# Patient Record
Sex: Male | Born: 1955 | Hispanic: Yes | Marital: Single | Smoking: Never smoker
Health system: Southern US, Community
[De-identification: ages and names within clinical notes are randomized; demographics above are authoritative.]

## PROBLEM LIST (undated history)

## (undated) DIAGNOSIS — I10 Essential (primary) hypertension: Secondary | ICD-10-CM

## (undated) DIAGNOSIS — F419 Anxiety disorder, unspecified: Secondary | ICD-10-CM

## (undated) HISTORY — PX: NO PAST SURGERIES: SHX2092

---

## 2013-11-26 ENCOUNTER — Encounter (HOSPITAL_COMMUNITY): Payer: Self-pay | Admitting: Emergency Medicine

## 2013-11-26 ENCOUNTER — Emergency Department (HOSPITAL_COMMUNITY)
Admission: EM | Admit: 2013-11-26 | Discharge: 2013-11-26 | Disposition: A | Discharge status: Home / Self Care | Payer: BC Managed Care – PPO | Attending: Emergency Medicine | Admitting: Emergency Medicine

## 2013-11-26 DIAGNOSIS — Z7982 Long term (current) use of aspirin: Secondary | ICD-10-CM | POA: Insufficient documentation

## 2013-11-26 DIAGNOSIS — I10 Essential (primary) hypertension: Secondary | ICD-10-CM | POA: Insufficient documentation

## 2013-11-26 DIAGNOSIS — K2990 Gastroduodenitis, unspecified, without bleeding: Secondary | ICD-10-CM

## 2013-11-26 DIAGNOSIS — Z79899 Other long term (current) drug therapy: Secondary | ICD-10-CM | POA: Insufficient documentation

## 2013-11-26 DIAGNOSIS — K279 Peptic ulcer, site unspecified, unspecified as acute or chronic, without hemorrhage or perforation: Secondary | ICD-10-CM | POA: Insufficient documentation

## 2013-11-26 DIAGNOSIS — K2970 Gastritis, unspecified, without bleeding: Secondary | ICD-10-CM | POA: Insufficient documentation

## 2013-11-26 DIAGNOSIS — R1013 Epigastric pain: Secondary | ICD-10-CM | POA: Insufficient documentation

## 2013-11-26 HISTORY — DX: Essential (primary) hypertension: I10

## 2013-11-26 LAB — COMPREHENSIVE METABOLIC PANEL
ALBUMIN: 4.1 g/dL (ref 3.5–5.2)
ALT: 17 U/L (ref 0–53)
AST: 19 U/L (ref 0–37)
Alkaline Phosphatase: 73 U/L (ref 39–117)
Anion gap: 15 (ref 5–15)
BUN: 15 mg/dL (ref 6–23)
CO2: 24 meq/L (ref 19–32)
Calcium: 9.2 mg/dL (ref 8.4–10.5)
Chloride: 99 mEq/L (ref 96–112)
Creatinine, Ser: 1.25 mg/dL (ref 0.50–1.35)
GFR calc Af Amer: 71 mL/min — ABNORMAL LOW (ref >90)
GFR, EST NON AFRICAN AMERICAN: 61 mL/min — AB (ref >90)
Glucose, Bld: 103 mg/dL — ABNORMAL HIGH (ref 70–99)
POTASSIUM: 4.5 meq/L (ref 3.7–5.3)
SODIUM: 138 meq/L (ref 137–147)
Total Bilirubin: 0.9 mg/dL (ref 0.3–1.2)
Total Protein: 7.1 g/dL (ref 6.0–8.3)

## 2013-11-26 LAB — CBC WITH DIFFERENTIAL/PLATELET
Basophils Absolute: 0 10*3/uL (ref 0.0–0.1)
Basophils Relative: 0 % (ref 0–1)
Eosinophils Absolute: 0.2 10*3/uL (ref 0.0–0.7)
Eosinophils Relative: 3 % (ref 0–5)
HEMATOCRIT: 47.9 % (ref 39.0–52.0)
Hemoglobin: 16.6 g/dL (ref 13.0–17.0)
LYMPHS PCT: 28 % (ref 12–46)
Lymphs Abs: 2.2 10*3/uL (ref 0.7–4.0)
MCH: 32.9 pg (ref 26.0–34.0)
MCHC: 34.7 g/dL (ref 30.0–36.0)
MCV: 95 fL (ref 78.0–100.0)
Monocytes Absolute: 0.7 10*3/uL (ref 0.1–1.0)
Monocytes Relative: 9 % (ref 3–12)
NEUTROS ABS: 4.6 10*3/uL (ref 1.7–7.7)
NEUTROS PCT: 60 % (ref 43–77)
Platelets: 210 10*3/uL (ref 150–400)
RBC: 5.04 MIL/uL (ref 4.22–5.81)
RDW: 12.3 % (ref 11.5–15.5)
WBC: 7.7 10*3/uL (ref 4.0–10.5)

## 2013-11-26 LAB — LIPASE, BLOOD: Lipase: 22 U/L (ref 11–59)

## 2013-11-26 MED ORDER — OMEPRAZOLE 20 MG PO CPDR: 20.0000 mg | DELAYED_RELEASE_CAPSULE | Freq: Two times a day (BID) | ORAL | Status: DC

## 2013-11-26 NOTE — Discharge Instructions (Signed)
Food Choices for Peptic Ulcer Disease When you have peptic ulcer disease, the foods you eat and your eating habits are very important. Choosing the right foods can help ease the discomfort of peptic ulcer disease. WHAT GENERAL GUIDELINES DO I NEED TO FOLLOW?  Choose fruits, vegetables, whole grains, and low-fat meat, fish, and poultry.   Keep a food diary to identify foods that cause symptoms.  Avoid foods that cause irritation or pain. These may be different for different people.  Eat frequent small meals instead of three large meals each day. The pain may be worse when your stomach is empty.  Avoid eating close to bedtime. WHAT FOODS ARE NOT RECOMMENDED? The following are some foods and drinks that may worsen your symptoms:  Black, white, and red pepper.  Hot sauce.  Chili peppers.  Chili powder.  Chocolate and cocoa.   Alcohol.  Tea, coffee, and cola (regular and decaffeinated). The items listed above may not be a complete list of foods and beverages to avoid. Contact your dietitian for more information. Document Released: 07/23/2011 Document Revised: 05/05/2013 Document Reviewed: 03/04/2013 [facility] LtdExitCare Patient Information 2015 [place], [place]. This information is not intended to replace advice given to you by your health care provider. Make sure you discuss any questions you have with your health care provider.  Abdominal Pain Many things can cause belly (abdominal) pain. Most times, the belly pain is not dangerous. Many cases of belly pain can be watched and treated at home. HOME CARE   Do not take medicines that help you go poop (laxatives) unless told to by your doctor.  Only take medicine as told by your doctor.  Eat or drink as told by your doctor. Your doctor will tell you if you should be on a special diet. GET HELP IF:  You do not know what is causing your belly pain.  You have belly pain while you are sick to your stomach (nauseous) or have runny poop  (diarrhea).  You have pain while you pee or poop.  Your belly pain wakes you up at night.  You have belly pain that gets worse or better when you eat.  You have belly pain that gets worse when you eat fatty foods.  You have a fever. GET HELP RIGHT AWAY IF:   The pain does not go away within 2 hours.  You keep throwing up (vomiting).  The pain changes and is only in the right or left part of the belly.  You have bloody or tarry looking poop. MAKE SURE YOU:   Understand these instructions.  Will watch your condition.  Will get help right away if you are not doing well or get worse. Document Released: 10/17/2007 Document Revised: 05/05/2013 Document Reviewed: 01/07/2013 [facility] CenterExit[facility] Patient Information 2015 [place]Care, [place]. This information is not intended to replace advice given to you by your health care provider. Make sure you discuss any questions you have with your health care provider.  Gastritis, Adult Gastritis is soreness and swelling (inflammation) of the lining of the stomach. Gastritis can develop as a sudden onset (acute) or long-term (chronic) condition. If gastritis is not treated, it can lead to stomach bleeding and ulcers. CAUSES  Gastritis occurs when the stomach lining is weak or damaged. Digestive juices from the stomach then inflame the weakened stomach lining. The stomach lining may be weak or damaged due to viral or bacterial infections. One common bacterial infection is the Helicobacter pylori infection. Gastritis can also result from excessive alcohol consumption, taking  certain medicines, or having too much acid in the stomach.  SYMPTOMS  In some cases, there are no symptoms. When symptoms are present, they may include:  Pain or a burning sensation in the upper abdomen.  Nausea.  Vomiting.  An uncomfortable feeling of fullness after eating. DIAGNOSIS  Your caregiver may suspect you have gastritis based on your symptoms and a physical exam. To determine  the cause of your gastritis, your caregiver may perform the following:  Blood or stool tests to check for the H pylori bacterium.  Gastroscopy. A thin, flexible tube (endoscope) is passed down the esophagus and into the stomach. The endoscope has a light and camera on the end. Your caregiver uses the endoscope to view the inside of the stomach.  Taking a tissue sample (biopsy) from the stomach to examine under a microscope. TREATMENT  Depending on the cause of your gastritis, medicines may be prescribed. If you have a bacterial infection, such as an H pylori infection, antibiotics may be given. If your gastritis is caused by too much acid in the stomach, H2 blockers or antacids may be given. Your caregiver may recommend that you stop taking aspirin, ibuprofen, or other nonsteroidal anti-inflammatory drugs (NSAIDs). HOME CARE INSTRUCTIONS  Only take over-the-counter or prescription medicines as directed by your caregiver.  If you were given antibiotic medicines, take them as directed. Finish them even if you start to feel better.  Drink enough fluids to keep your urine clear or pale yellow.  Avoid foods and drinks that make your symptoms worse, such as:  Caffeine or alcoholic drinks.  Chocolate.  Peppermint or mint flavorings.  Garlic and onions.  Spicy foods.  Citrus fruits, such as oranges, lemons, or limes.  Tomato-based foods such as sauce, chili, salsa, and pizza.  Fried and fatty foods.  Eat small, frequent meals instead of large meals. SEEK IMMEDIATE MEDICAL CARE IF:   You have black or dark red stools.  You vomit blood or material that looks like coffee grounds.  You are unable to keep fluids down.  Your abdominal pain gets worse.  You have a fever.  You do not feel better after 1 week.  You have any other questions or concerns. MAKE SURE YOU:  Understand these instructions.  Will watch your condition.  Will get help right away if you are not doing well  or get worse. Document Released: 04/24/2001 Document Revised: 10/30/2011 Document Reviewed: 06/13/2011 [facility] Patient Information 2015 [place], [place]. This information is not intended to replace advice given to you by your health care provider. Make sure you discuss any questions you have with your health care provider.

## 2013-11-26 NOTE — ED Notes (Signed)
Pt c/o right sided abd pain x 1 year that is getting more severe; pt sent here for eval

## 2013-11-26 NOTE — ED Provider Notes (Signed)
CSN: [id number]     Arrival date & time 11/26/13  1454 History   First MD Initiated Contact with Patient 11/26/13 1801     Chief Complaint  Patient presents with  . Abdominal Pain      HPI  Patient presents with an episode of epigastric abdominal pain almost daily for the last year. He states that it is fairly well localized epigastric and right mid abdomen. It is relieved most often with antacids yogurt milk or ice cream. His never taken an acid of the Rolaids. He drinks alcohol infrequently. He does not smoke. 2 servings of caffeine a day. No anti-inflammatory use. No fatty food intolerances. No no left-sided or back pain to suggest pancreatitis and no excessive alcohol use.  Past Medical History  Diagnosis Date  . Hypertension    History reviewed. No pertinent past surgical history. History reviewed. No pertinent family history. History  Substance Use Topics  . Smoking status: Never Smoker   . Smokeless tobacco: Not on file  . Alcohol Use: Yes     Comment: occ    Review of Systems  Constitutional: Negative for fever, chills, diaphoresis, appetite change and fatigue.  HENT: Negative for mouth sores, sore throat and trouble swallowing.   Eyes: Negative for visual disturbance.  Respiratory: Negative for cough, chest tightness, shortness of breath and wheezing.   Cardiovascular: Negative for chest pain.  Gastrointestinal: Positive for abdominal pain. Negative for nausea, vomiting, diarrhea and abdominal distention.  Endocrine: Negative for polydipsia, polyphagia and polyuria.  Genitourinary: Negative for dysuria, frequency and hematuria.  Musculoskeletal: Negative for gait problem.  Skin: Negative for color change, pallor and rash.  Neurological: Negative for dizziness, syncope, light-headedness and headaches.  Hematological: Does not bruise/bleed easily.  Psychiatric/Behavioral: Negative for behavioral problems and confusion.      Allergies  Review of patient's allergies  indicates no known allergies.  Home Medications   Prior to Admission medications   Medication Sig Start Date End Date Taking? Authorizing Provider  aspirin EC 81 MG tablet Take 81 mg by mouth daily.   Yes Historical Provider, MD  atorvastatin (LIPITOR) 10 MG tablet Take 10 mg by mouth daily.   Yes Historical Provider, MD  carvedilol (COREG) 3.125 MG tablet Take 3.125 mg by mouth daily.   Yes Historical Provider, MD  clonazePAM (KLONOPIN) 1 MG tablet Take 1 mg by mouth daily as needed for anxiety.   Yes Historical Provider, MD  niacin 500 MG tablet Take 500 mg by mouth daily.   Yes Historical Provider, MD  omega-3 acid ethyl esters (LOVAZA) 1 G capsule Take 1 g by mouth daily.   Yes Historical Provider, MD  valsartan-hydrochlorothiazide (DIOVAN-HCT) 320-25 MG per tablet Take 1 tablet by mouth daily.   Yes Historical Provider, MD  omeprazole (PRILOSEC) 20 MG capsule Take 1 capsule (20 mg total) by mouth 2 (two) times daily. 11/26/13   Rolland PorterMark Lonnie Reth, MD   BP 152/105  Pulse 73  Temp(Src) 97.2 F (36.2 C) (Oral)  Resp 18  Wt 205 lb 7 oz (93.186 kg)  SpO2 98% Physical Exam  Constitutional: He is oriented to person, place, and time. He appears well-developed and well-nourished. No distress.  HENT:  Head: Normocephalic.  Eyes: Conjunctivae are normal. Pupils are equal, round, and reactive to light. No scleral icterus.  Neck: Normal range of motion. Neck supple. No thyromegaly present.  Cardiovascular: Normal rate and regular rhythm.  Exam reveals no gallop and no friction rub.   No murmur heard. Pulmonary/Chest:  Effort normal and breath sounds normal. No respiratory distress. He has no wheezes. He has no rales.  Abdominal: Soft. Bowel sounds are normal. He exhibits no distension. There is tenderness. There is no rebound.    Musculoskeletal: Normal range of motion.  Neurological: He is alert and oriented to person, place, and time.  Skin: Skin is warm and dry. No rash noted.  Psychiatric: He  has a normal mood and affect. His behavior is normal.    ED Course  Procedures (including critical care time) Labs Review Labs Reviewed  COMPREHENSIVE METABOLIC PANEL - Abnormal; Notable for the following:    Glucose, Bld 103 (*)    GFR calc non Af Amer 61 (*)    GFR calc Af Amer 71 (*)    All other components within normal limits  CBC WITH DIFFERENTIAL  LIPASE, BLOOD  URINALYSIS, ROUTINE W REFLEX MICROSCOPIC    Imaging Review No results found.   EKG Interpretation None      MDM   Final diagnoses:  Epigastric pain  Peptic ulcer disease  Gastritis    Normal hepatobiliary and pancreatic enzymes. Plan is treatment with proton pump inhibitor. Diet discussed. Primary care followup.    Rolland Porter, MD 11/26/13 [phone]

## 2013-12-24 ENCOUNTER — Other Ambulatory Visit: Payer: Self-pay | Admitting: Gastroenterology

## 2013-12-24 ENCOUNTER — Ambulatory Visit
Admission: RE | Admit: 2013-12-24 | Discharge: 2013-12-24 | Disposition: A | Discharge status: Home / Self Care | Payer: BC Managed Care – PPO | Source: Ambulatory Visit | Attending: Gastroenterology | Admitting: Gastroenterology

## 2013-12-24 DIAGNOSIS — R1031 Right lower quadrant pain: Secondary | ICD-10-CM

## 2013-12-24 MED ORDER — IOHEXOL 300 MG/ML  SOLN
100.0000 mL | Freq: Once | INTRAMUSCULAR | Status: AC | PRN
  Administered 2013-12-24: 100 mL via INTRAVENOUS

## 2013-12-29 ENCOUNTER — Encounter (HOSPITAL_COMMUNITY): Payer: Self-pay | Admitting: *Deleted

## 2013-12-29 ENCOUNTER — Other Ambulatory Visit: Payer: Self-pay | Admitting: Urology

## 2014-01-04 ENCOUNTER — Encounter (HOSPITAL_COMMUNITY)
Admission: RE | Disposition: A | Discharge status: Home / Self Care | Payer: Self-pay | Source: Ambulatory Visit | Attending: Urology

## 2014-01-04 ENCOUNTER — Ambulatory Visit (HOSPITAL_COMMUNITY)
Admission: RE | Admit: 2014-01-04 | Discharge: 2014-01-04 | Disposition: A | Discharge status: Home / Self Care | Payer: BC Managed Care – PPO | Source: Ambulatory Visit | Attending: Urology | Admitting: Urology

## 2014-01-04 ENCOUNTER — Encounter (HOSPITAL_COMMUNITY): Payer: Self-pay | Admitting: General Practice

## 2014-01-04 ENCOUNTER — Ambulatory Visit (HOSPITAL_COMMUNITY): Payer: BC Managed Care – PPO

## 2014-01-04 DIAGNOSIS — Z79899 Other long term (current) drug therapy: Secondary | ICD-10-CM | POA: Diagnosis not present

## 2014-01-04 DIAGNOSIS — E039 Hypothyroidism, unspecified: Secondary | ICD-10-CM | POA: Diagnosis not present

## 2014-01-04 DIAGNOSIS — E7800 Pure hypercholesterolemia, unspecified: Secondary | ICD-10-CM | POA: Insufficient documentation

## 2014-01-04 DIAGNOSIS — N200 Calculus of kidney: Secondary | ICD-10-CM | POA: Diagnosis not present

## 2014-01-04 DIAGNOSIS — Z7982 Long term (current) use of aspirin: Secondary | ICD-10-CM | POA: Insufficient documentation

## 2014-01-04 HISTORY — DX: Anxiety disorder, unspecified: F41.9

## 2014-01-04 SURGERY — LITHOTRIPSY, ESWL
Anesthesia: LOCAL | Laterality: Right

## 2014-01-04 MED ORDER — CIPROFLOXACIN HCL 500 MG PO TABS
500.0000 mg | ORAL_TABLET | ORAL | Status: AC
  Administered 2014-01-04: 500 mg via ORAL
  Filled 2014-01-04: qty 1

## 2014-01-04 MED ORDER — DEXTROSE-NACL 5-0.45 % IV SOLN
INTRAVENOUS | Status: DC
  Administered 2014-01-04: 16:00:00 via INTRAVENOUS

## 2014-01-04 MED ORDER — DIAZEPAM 5 MG PO TABS
10.0000 mg | ORAL_TABLET | ORAL | Status: AC
  Administered 2014-01-04: 10 mg via ORAL
  Filled 2014-01-04: qty 2

## 2014-01-04 MED ORDER — DIPHENHYDRAMINE HCL 25 MG PO CAPS
25.0000 mg | ORAL_CAPSULE | ORAL | Status: AC
  Administered 2014-01-04: 25 mg via ORAL
  Filled 2014-01-04: qty 1

## 2014-01-04 NOTE — H&P (Signed)
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 + hx of stone at age 31, spontaneously passed, without analysis. he has 2nd stone several years ago, right side, post lithotripsy, without passage of granules, or f/u CT in [place].   He now notes 6 months of intermittent Right flank pain without radiation, or N,V. No hematuria. He has had CT evaluation showing a intheRight extra-renal pelvis, with ? intermittent obstruction . Pt drinks water only. No sodas.   Past Medical History Problems  1. History of hypercholesterolemia (V12.29) 2. History of hypothyroidism (V12.29)  Surgical History Problems  1. History of Hernia Repair  Current Meds 1. Aspirin 81 MG Oral Tablet;  Therapy: (Recorded:17Aug2015) to Recorded 2. Atorvastatin Calcium 10 MG Oral Tablet;  Therapy: (Recorded:17Aug2015) to Recorded 3. Carvedilol TABS;  Therapy: (Recorded:17Aug2015) to Recorded 4. Fenofibrate 50 MG Oral Capsule;  Therapy: (Recorded:17Aug2015) to Recorded 5. Niacin Flush Free 500 MG Oral Capsule;  Therapy: (Recorded:17Aug2015) to Recorded 6. Omega 3 CAPS;  Therapy: (Recorded:17Aug2015) to Recorded 7. Valsartan-Hydrochlorothiazide 320-25 MG Oral Tablet;  Therapy: (Recorded:17Aug2015) to Recorded  Allergies Medication  1. No Known Drug Allergies  Family History Problems  1. Family history of Heart trouble : Father  Social History Problems    Alcohol use (V49.89)   4-5/week   Caffeine use (V49.89)   2   Never a smoker   Single  Review of Systems Constitutional, skin, eye, otolaryngeal, hematologic/lymphatic, cardiovascular, pulmonary, endocrine, musculoskeletal, neurological and psychiatric system(s) were reviewed and pertinent findings if present are noted.   Genitourinary: no urinary frequency, no feelings of urinary urgency, no dysuria, no nocturia, no incontinence, no difficulty starting the urinary stream, urine stream is not weak, urinary stream does not start and stop, no incomplete emptying of bladder, no post-void dribbling, no hematuria and initiating urination does not require straining.  Gastrointestinal: flank pain, but no nausea, no vomiting, no abdominal pain, no heartburn, no diarrhea, no constipation and no melena.    Vitals Vital Signs [Data Includes: Last 1 Day]  Recorded: 17Aug2015 01:15PM  Weight: 205 lb  Blood Pressure: 122 / 88 Temperature: 98.1 F Heart Rate: 65  Physical Exam Constitutional: Well nourished and well developed . No acute distress.  ENT:. The ears and nose are normal in appearance.  Neck: The appearance of the neck is normal and no neck mass is present.  Pulmonary: No respiratory distress and normal respiratory rhythm and effort.  Cardiovascular: Heart rate and rhythm are normal . No peripheral edema.  Abdomen: The abdomen is soft and nontender. No masses are palpated. mild right CVA tenderness. No hernias are palpable. No hepatosplenomegaly noted.  Rectal: Rectal exam demonstrates normal sphincter tone, no tenderness and no masses. The prostate has no nodularity and is not tender. The left seminal vesicle is nonpalpable. The right seminal vesicle is nonpalpable. The perineum is normal on inspection.  Genitourinary: Examination of the penis demonstrates no discharge, no masses, no lesions and a normal meatus. The scrotum is without lesions. The right epididymis is palpably normal and non-tender. The left epididymis is palpably normal and non-tender. The right testis is non-tender and without masses. The left testis is non-tender and without masses.  Lymphatics: The femoral and inguinal nodes are not enlarged or tender.  Skin: Normal skin turgor, no visible rash and no  visible skin lesions.  Neuro/Psych:. Mood  and affect are appropriate.    Results/Data Urine [Data Includes: Last 1 Day]   17Aug2015  COLOR YELLOW   APPEARANCE CLEAR   SPECIFIC GRAVITY 1.025   pH 5.5   GLUCOSE NEG mg/dL  BILIRUBIN NEG   KETONE NEG mg/dL  BLOOD MOD   PROTEIN NEG mg/dL  UROBILINOGEN 0.2 mg/dL  NITRITE NEG   LEUKOCYTE ESTERASE NEG   SQUAMOUS EPITHELIAL/HPF NONE SEEN   WBC NONE SEEN WBC/hpf  RBC 7-10 RBC/hpf  BACTERIA NONE SEEN   CRYSTALS NONE SEEN   CASTS NONE SEEN    Assessment Assessed  1. Nephrolithiasis (592.0)  Microhematuria, mild R flank pain, CT showing Right extrarenal pelvis with 10mm stone. Reviewed with patient. He will have lithotripsy   Plan Health Maintenance  1. UA With REFLEX; [Do Not Release]; Status:Resulted - Requires Verification;   Done:  17Aug2015 12:10PM Nephrolithiasis  2. Start: Oxycodone-Acetaminophen 10-325 MG Oral Tablet; Bring medication with you to  appointment.  Take 1 tablet every 4-6 hours as needed for pain  Schedule lithotripsy.   Discussion/Summary cc: Dr. Sharyn Lull  cc: Dr. Arty Baumgartner     Signatures Electronically signed by : Jethro Bolus, M.D.; Dec 28 2013  5:35PM EST

## 2014-01-04 NOTE — Interval H&P Note (Signed)
History and Physical Interval Note:  01/04/2014 6:07 PM  Todd [name]  has presented today for surgery, with the diagnosis of Right Renal Pelvic Stone  The various methods of treatment have been discussed with the patient and family. After consideration of risks, benefits and other options for treatment, the patient has consented to  Procedure(s): RIGHT EXTRACORPOREAL SHOCK WAVE LITHOTRIPSY (ESWL) (Right) as a surgical intervention .  The patient's history has been reviewed, patient examined, no change in status, stable for surgery.  I have reviewed the patient's chart and labs.  Questions were answered to the patient's satisfaction.     Jethro Bolus I

## 2014-01-04 NOTE — Discharge Instructions (Signed)
Kidney Stones °Kidney stones (urolithiasis) are solid masses that form inside your kidneys. The intense pain is caused by the stone moving through the kidney, ureter, bladder, and urethra (urinary tract). When the stone moves, the ureter starts to spasm around the stone. The stone is usually passed in your pee (urine).  °HOME CARE °· Drink enough fluids to keep your pee clear or pale yellow. This helps to get the stone out. °· Strain all pee through the provided strainer. Do not pee without peeing through the strainer, not even once. If you pee the stone out, catch it in the strainer. The stone may be as small as a grain of salt. Take this to your doctor. This will help your doctor figure out what you can do to try to prevent more kidney stones. °· Only take medicine as told by your doctor. °· Follow up with your doctor as told. °· Get follow-up X-rays as told by your doctor. °GET HELP IF: °You have pain that gets worse even if you have been taking pain medicine. °GET HELP RIGHT AWAY IF:  °· Your pain does not get better with medicine. °· You have a fever or shaking chills. °· Your pain increases and gets worse over 18 hours. °· You have new belly (abdominal) pain. °· You feel faint or pass out. °· You are unable to pee. °MAKE SURE YOU:  °· Understand these instructions. °· Will watch your condition. °· Will get help right away if you are not doing well or get worse. °Document Released: 10/17/2007 Document Revised: 12/31/2012 Document Reviewed: 10/01/2012 °ExitCare® Patient Information ©2015 [employer], LLC. This information is not intended to replace advice given to you by your health care provider. Make sure you discuss any questions you have with your health care provider. ° °Dietary Guidelines to Help Prevent Kidney Stones °Your risk of kidney stones can be decreased by adjusting the foods you eat. The most important thing you can do is drink enough fluid. You should drink enough fluid to keep your urine clear or  pale yellow. The following guidelines provide specific information for the type of kidney stone you have had. °GUIDELINES ACCORDING TO TYPE OF KIDNEY STONE °Calcium Oxalate Kidney Stones °· Reduce the amount of salt you eat. Foods that have a lot of salt cause your body to release excess calcium into your urine. The excess calcium can combine with a substance called oxalate to form kidney stones. °· Reduce the amount of animal protein you eat if the amount you eat is excessive. Animal protein causes your body to release excess calcium into your urine. Ask your dietitian how much protein from animal sources you should be eating. °· Avoid foods that are high in oxalates. If you take vitamins, they should have less than 500 mg of vitamin C. Your body turns vitamin C into oxalates. You do not need to avoid fruits and vegetables high in vitamin C. °Calcium Phosphate Kidney Stones °· Reduce the amount of salt you eat to help prevent the release of excess calcium into your urine. °· Reduce the amount of animal protein you eat if the amount you eat is excessive. Animal protein causes your body to release excess calcium into your urine. Ask your dietitian how much protein from animal sources you should be eating. °· Get enough calcium from food or take a calcium supplement (ask your dietitian for recommendations). Food sources of calcium that do not increase your risk of kidney stones include: °¨ Broccoli. °¨ Dairy products,   such as cheese and yogurt. °¨ Pudding. °Uric Acid Kidney Stones °· Do not have more than 6 oz of animal protein per day. °FOOD SOURCES °Animal Protein Sources °· Meat (all types). °· Poultry. °· Eggs. °· Fish, seafood. °Foods High in Salt °· Salt seasonings. °· Soy sauce. °· Teriyaki sauce. °· Cured and processed meats. °· Salted crackers and snack foods. °· Fast food. °· Canned soups and most canned foods. °Foods High in Oxalates °· Grains: °¨ Amaranth. °¨ Barley. °¨ Grits. °¨ Wheat  germ. °¨ Bran. °¨ Buckwheat flour. °¨ All bran cereals. °¨ Pretzels. °¨ Whole wheat bread. °· Vegetables: °¨ Beans (wax). °¨ Beets and beet greens. °¨ Collard greens. °¨ Eggplant. °¨ Escarole. °¨ Leeks. °¨ Okra. °¨ Parsley. °¨ Rutabagas. °¨ Spinach. °¨ Swiss chard. °¨ Tomato paste. °¨ Fried potatoes. °¨ Sweet potatoes. °· Fruits: °¨ Red currants. °¨ Figs. °¨ Kiwi. °¨ Rhubarb. °· Meat and Other Protein Sources: °¨ Beans (dried). °¨ Soy burgers and other soybean products. °¨ Miso. °¨ Nuts (peanuts, almonds, pecans, cashews, hazelnuts). °¨ Nut butters. °¨ Sesame seeds and tahini (paste made of sesame seeds). °¨ Poppy seeds. °· Beverages: °¨ Chocolate drink mixes. °¨ Soy milk. °¨ Instant iced tea. °¨ Juices made from high-oxalate fruits or vegetables. °· Other: °¨ Carob. °¨ Chocolate. °¨ Fruitcake. °¨ Marmalades. °Document Released: 08/25/2010 Document Revised: 05/05/2013 Document Reviewed: 03/27/2013 °ExitCare® Patient Information ©2015 [employer], LLC. This information is not intended to replace advice given to you by your health care provider. Make sure you discuss any questions you have with your health care provider. ° °

## 2015-04-27 ENCOUNTER — Other Ambulatory Visit: Payer: Self-pay | Admitting: Cardiology

## 2015-04-27 ENCOUNTER — Ambulatory Visit
Admission: RE | Admit: 2015-04-27 | Discharge: 2015-04-27 | Disposition: A | Discharge status: Home / Self Care | Payer: BLUE CROSS/BLUE SHIELD | Source: Ambulatory Visit | Attending: Cardiology | Admitting: Cardiology

## 2015-04-27 DIAGNOSIS — M25512 Pain in left shoulder: Principal | ICD-10-CM

## 2015-04-27 DIAGNOSIS — M25511 Pain in right shoulder: Secondary | ICD-10-CM

## 2015-07-13 IMAGING — CT CT ABD-PELV W/ CM
2 of 5 series · 16 of 46 positions shown, 18 images · IV contrast (READICAT/WATER & [ID] OMNI 300)
Comparison: None.

CLINICAL DATA: Right lower quadrant abdominal pain.

EXAM:
CT ABDOMEN AND PELVIS WITH CONTRAST
TECHNIQUE: Multidetector CT imaging of the abdomen and pelvis was performed
using the standard protocol following bolus administration of
intravenous contrast.
CONTRAST:  100mL OMNIPAQUE IOHEXOL 300 MG/ML  SOLN

[Series 2: abd/pelvis with · axial · 0.79mm/px · z∈[-328,+37]mm · 13 of 83 slices shown, 15 images]
[im 5/83  soft-tissue]
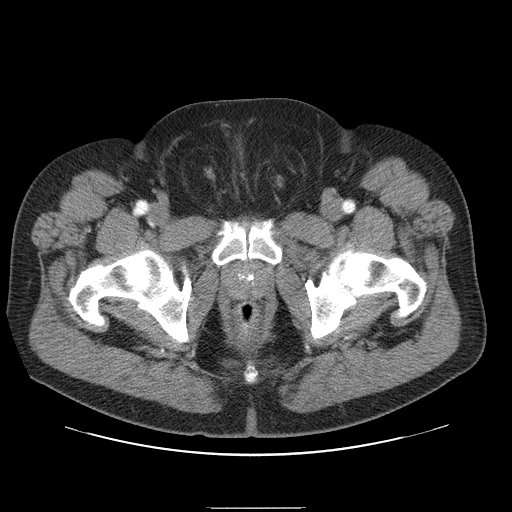
[im 5/83  bone]
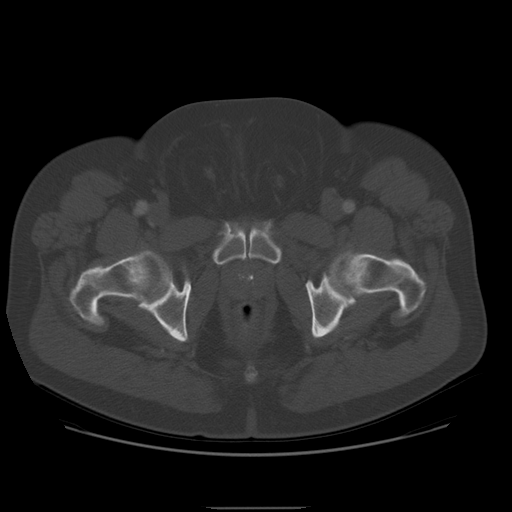
[im 10/83  soft-tissue]
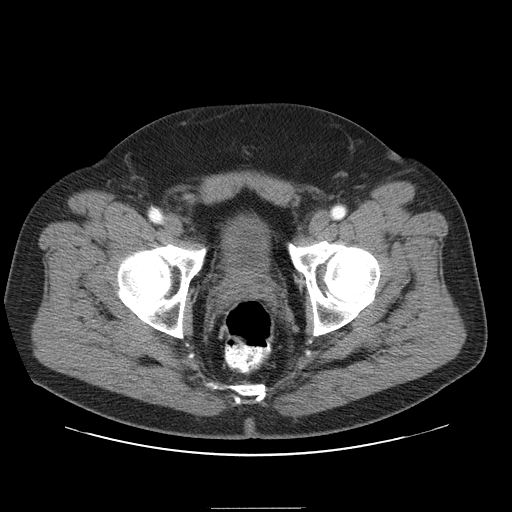
[im 20/83  soft-tissue]
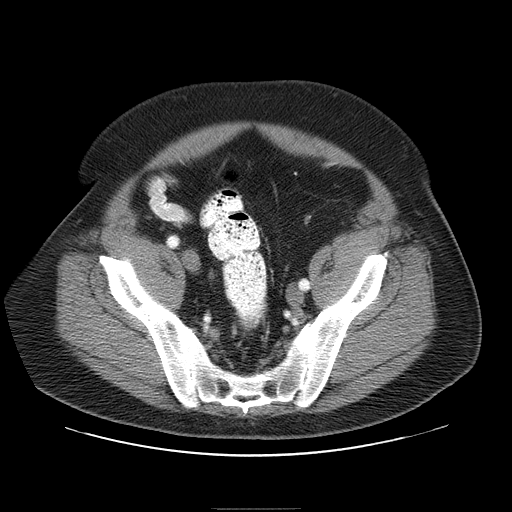
[im 25/83  soft-tissue]
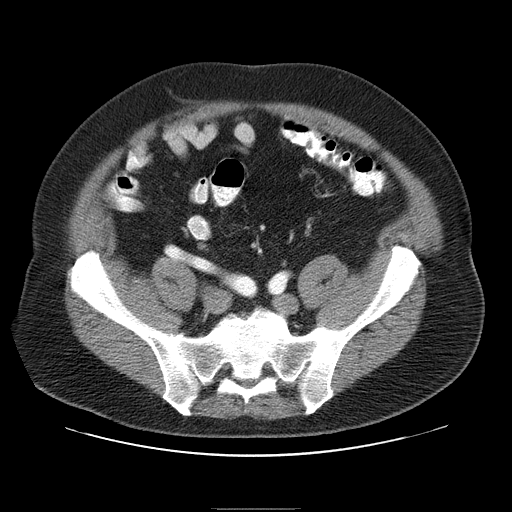
[im 29/83  soft-tissue]
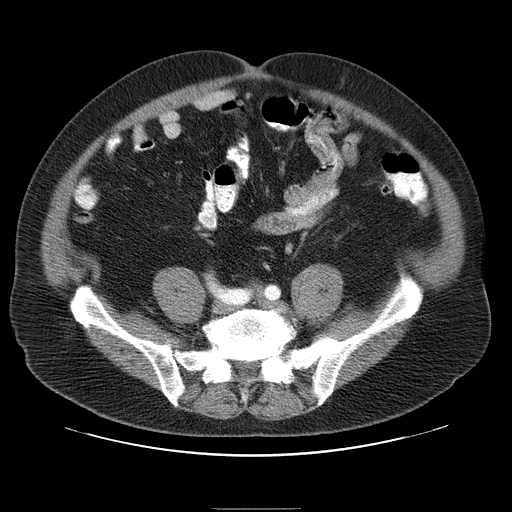
[im 34/83  soft-tissue]
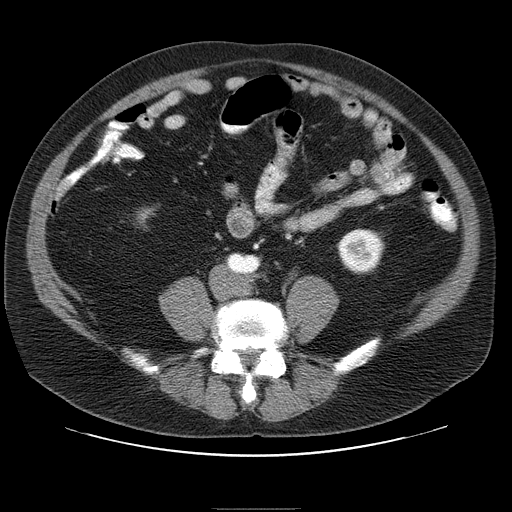
[im 44/83  soft-tissue]
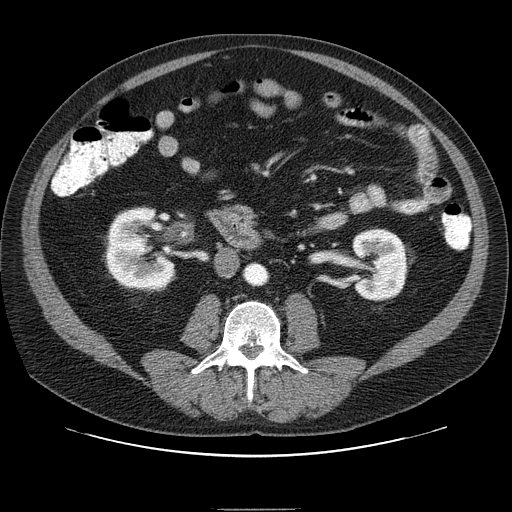
[im 49/83  soft-tissue]
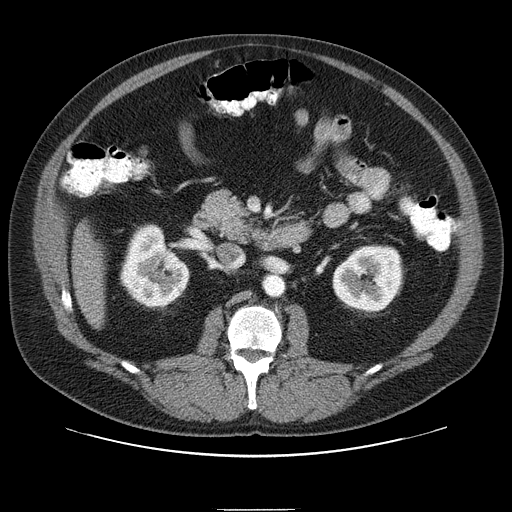
[im 54/83  soft-tissue]
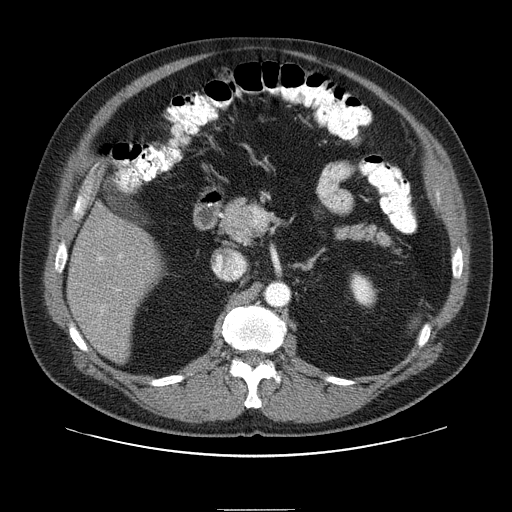
[im 54/83  bone]
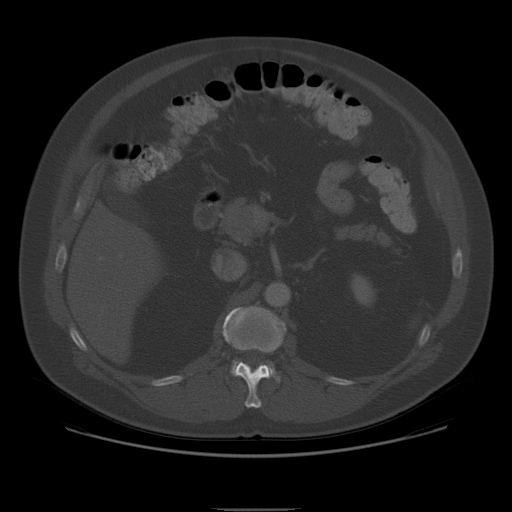
[im 58/83  soft-tissue]
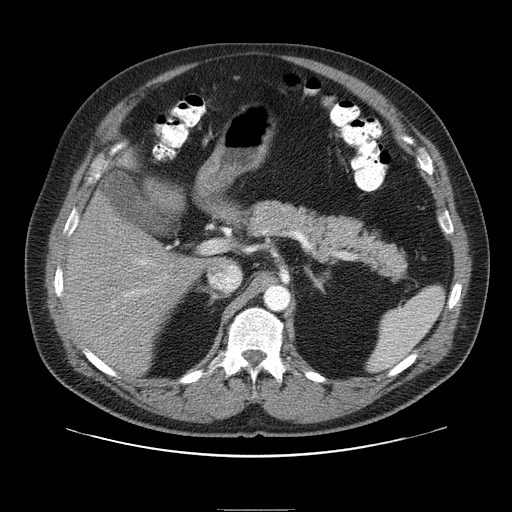
[im 63/83  soft-tissue]
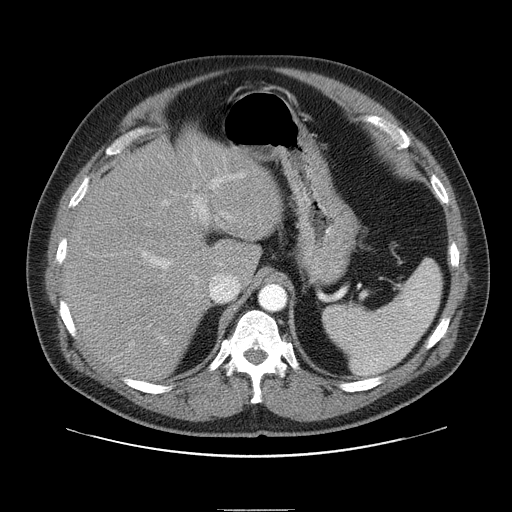
[im 73/83  soft-tissue]
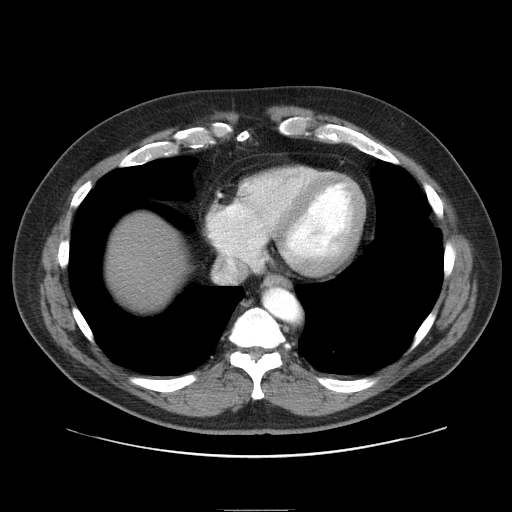
[im 78/83  soft-tissue]
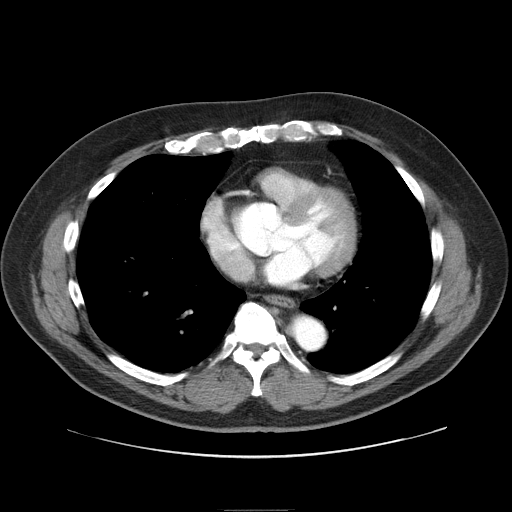

[Series 400: cor · coronal · 0.91mm/px · 3 of 168 slices shown]
[im 56/168  soft-tissue]
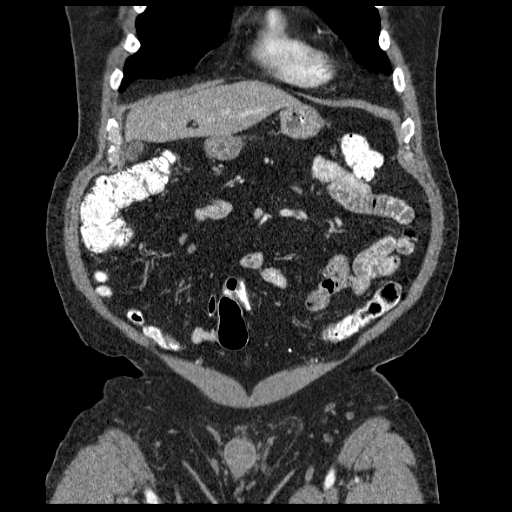
[im 75/168  soft-tissue]
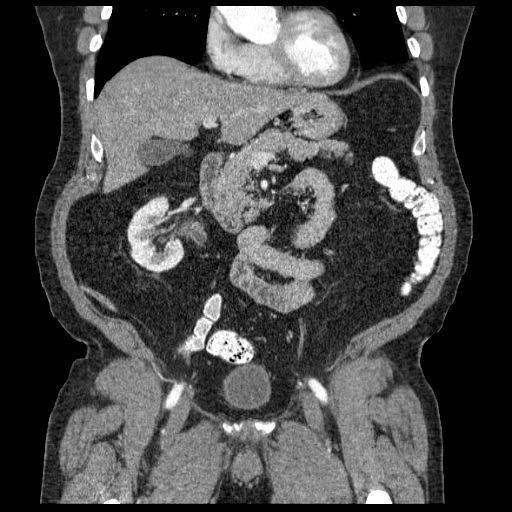
[im 93/168  soft-tissue]
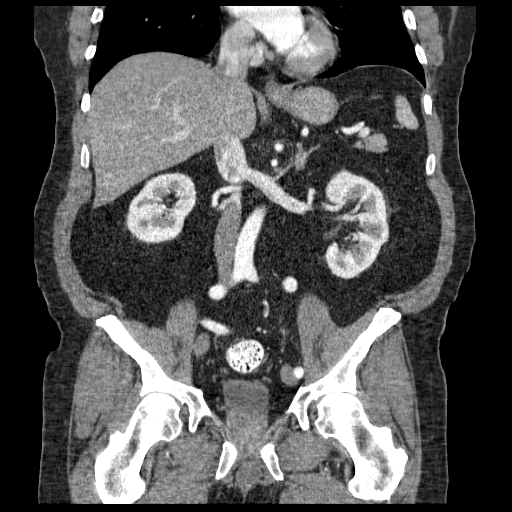

[16 of 46 positions shown; findings below may reference images not displayed]

FINDINGS: Lung bases:

No pulmonary nodule or pleural effusion. The heart is normal in
size. No pericardial effusion. The distal esophagus is unremarkable.

CT abdomen:

There is diffuse fatty infiltration of the liver but no focal
hepatic lesions or intrahepatic biliary dilatation. The gallbladder
is normal. No common bile duct dilatation. The pancreas is normal.
The spleen is normal. The adrenal glands are normal and the left
kidney is normal. There is a 10 mm calculus in the right extra renal
pelvis. This certainly could be intermittently obstructing the
ureteral pelvic junction and accounting for the patient's
intermittent right flank pain. No obstruction is demonstrated at
this time. The ureters are normal. The bladder is normal.

The stomach, duodenum, small bowel and colon are unremarkable. No
inflammatory changes, mass lesions or obstructive findings. The
appendix is normal. No mesenteric or retroperitoneal mass or
adenopathy. The aorta and branch vessels are patent. The major
venous structures are patent.

CT pelvis:

The bladder, prostate gland and seminal vesicles are unremarkable.
No pelvic mass or adenopathy. No free pelvic fluid collections. No
inguinal mass or adenopathy. Bilateral prominent inguinal rings
containing fat.

Bony structures:

No significant bony findings. Interbody fusion changes noted at
L5-S1.
IMPRESSION: 10 mm calculus in the extra renal pelvis of the right kidney could
be intermittently obstructing the UPJ and accounting for the
patient's intermittent right flank pain. No current obstruction.

No acute abdominal/ pelvic findings, mass lesions or adenopathy.

## 2017-10-25 ENCOUNTER — Other Ambulatory Visit: Payer: Self-pay | Admitting: Otolaryngology

## 2017-10-25 DIAGNOSIS — H903 Sensorineural hearing loss, bilateral: Secondary | ICD-10-CM

## 2019-10-22 DIAGNOSIS — I10 Essential (primary) hypertension: Secondary | ICD-10-CM | POA: Diagnosis not present

## 2019-10-22 DIAGNOSIS — R82998 Other abnormal findings in urine: Secondary | ICD-10-CM | POA: Diagnosis not present

## 2019-10-22 DIAGNOSIS — G4700 Insomnia, unspecified: Secondary | ICD-10-CM | POA: Diagnosis not present

## 2019-10-22 DIAGNOSIS — Z1331 Encounter for screening for depression: Secondary | ICD-10-CM | POA: Diagnosis not present

## 2019-10-22 DIAGNOSIS — E7800 Pure hypercholesterolemia, unspecified: Secondary | ICD-10-CM | POA: Diagnosis not present

## 2019-10-22 DIAGNOSIS — G250 Essential tremor: Secondary | ICD-10-CM | POA: Diagnosis not present

## 2019-10-22 DIAGNOSIS — Z125 Encounter for screening for malignant neoplasm of prostate: Secondary | ICD-10-CM | POA: Diagnosis not present

## 2020-01-12 DIAGNOSIS — E7800 Pure hypercholesterolemia, unspecified: Secondary | ICD-10-CM | POA: Diagnosis not present

## 2020-01-12 DIAGNOSIS — G4700 Insomnia, unspecified: Secondary | ICD-10-CM | POA: Diagnosis not present

## 2020-01-12 DIAGNOSIS — I10 Essential (primary) hypertension: Secondary | ICD-10-CM | POA: Diagnosis not present

## 2020-01-12 DIAGNOSIS — G250 Essential tremor: Secondary | ICD-10-CM | POA: Diagnosis not present

## 2020-08-11 DIAGNOSIS — E785 Hyperlipidemia, unspecified: Secondary | ICD-10-CM | POA: Diagnosis not present

## 2020-08-11 DIAGNOSIS — N189 Chronic kidney disease, unspecified: Secondary | ICD-10-CM | POA: Diagnosis not present

## 2020-08-11 DIAGNOSIS — I10 Essential (primary) hypertension: Secondary | ICD-10-CM | POA: Diagnosis not present

## 2020-08-11 DIAGNOSIS — M1990 Unspecified osteoarthritis, unspecified site: Secondary | ICD-10-CM | POA: Diagnosis not present

## 2020-08-11 DIAGNOSIS — R7303 Prediabetes: Secondary | ICD-10-CM | POA: Diagnosis not present

## 2020-09-08 DIAGNOSIS — E785 Hyperlipidemia, unspecified: Secondary | ICD-10-CM | POA: Diagnosis not present

## 2020-09-08 DIAGNOSIS — I10 Essential (primary) hypertension: Secondary | ICD-10-CM | POA: Diagnosis not present

## 2020-09-30 DIAGNOSIS — H52223 Regular astigmatism, bilateral: Secondary | ICD-10-CM | POA: Diagnosis not present

## 2020-09-30 DIAGNOSIS — H524 Presbyopia: Secondary | ICD-10-CM | POA: Diagnosis not present

## 2020-11-10 DIAGNOSIS — I10 Essential (primary) hypertension: Secondary | ICD-10-CM | POA: Diagnosis not present

## 2020-11-10 DIAGNOSIS — R7303 Prediabetes: Secondary | ICD-10-CM | POA: Diagnosis not present

## 2020-11-10 DIAGNOSIS — N189 Chronic kidney disease, unspecified: Secondary | ICD-10-CM | POA: Diagnosis not present

## 2020-11-10 DIAGNOSIS — E785 Hyperlipidemia, unspecified: Secondary | ICD-10-CM | POA: Diagnosis not present

## 2020-11-10 DIAGNOSIS — F419 Anxiety disorder, unspecified: Secondary | ICD-10-CM | POA: Diagnosis not present

## 2021-02-07 DIAGNOSIS — R7303 Prediabetes: Secondary | ICD-10-CM | POA: Diagnosis not present

## 2021-02-07 DIAGNOSIS — E785 Hyperlipidemia, unspecified: Secondary | ICD-10-CM | POA: Diagnosis not present

## 2021-02-07 DIAGNOSIS — I10 Essential (primary) hypertension: Secondary | ICD-10-CM | POA: Diagnosis not present

## 2021-04-14 ENCOUNTER — Ambulatory Visit: Payer: PPO | Admitting: Neurology

## 2021-04-14 ENCOUNTER — Encounter: Payer: Self-pay | Admitting: Neurology

## 2021-04-14 VITALS — BP 106/76 | HR 88 | Ht 68.0 in | Wt 183.0 lb

## 2021-04-14 DIAGNOSIS — R251 Tremor, unspecified: Secondary | ICD-10-CM | POA: Diagnosis not present

## 2021-04-14 DIAGNOSIS — G20 Parkinson's disease: Secondary | ICD-10-CM

## 2021-04-14 MED ORDER — CARBIDOPA-LEVODOPA 25-100 MG PO TABS
1.0000 | ORAL_TABLET | Freq: Three times a day (TID) | ORAL | 6 refills | Status: DC
Start: 2021-04-14 — End: 2021-07-13

## 2021-04-14 MED ORDER — QUETIAPINE FUMARATE 25 MG PO TABS: 50.0000 mg | ORAL_TABLET | Freq: Every day | ORAL | 6 refills | Status: DC

## 2021-04-14 NOTE — Progress Notes (Signed)
Chief Complaint  Patient presents with   New patient RM 15    Here alone for right arm tremor started about 6 months ago. He thinks it will go away. He averages 2 hours per night of sleep. It always tremors. He can't do anything without it shaking. If he lays on his left side his teeth start clattering.       ASSESSMENT AND PLAN  Todd [name] is a 65 y.o. 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, mild right more than left rigidity, bradykinesia, retropulsion stability, decreased right arm swing on ambulation,  Atypical features is rapid progression of his tremor by history,   I have suggested MRI of the brain, DaTSCAN for further evaluation, he declined,  Laboratory evaluation including TSH  Trial of Sinemet 25/100 mg 3 times daily  Also encouraging moderate exercise  Chronic insomnia  In the background of anxiety,  I have suggested continue follow-up with primary care physician, Seroquel 25 mg every night may titrating to 50 mg every night   DIAGNOSTIC DATA (LABS, IMAGING, TESTING) - I reviewed patient records, labs, notes, testing and imaging myself where available.   MEDICAL HISTORY:  Todd [name] is a 65 year old male, seen in request by his primary care physician Dr.   Rinaldo Cloud, for evaluation of tremor, difficulty sleeping, initial evaluation was on April 14, 2021.  I reviewed and summarized the referring note. PMhx HTN HLD Anxiety,   He is retired Personnel officer, used to be very active, runs 6 miles without any difficulty, since retirement few years ago, moved from [place] to [place], become very sedentary, around 2021, he noticed right hand tremor, per history progress quickly, now involving right leg, also left hand,  He denies gait abnormality, denies loss sense of smell  His main concern is his worsening chronic insomnia, has been ongoing for many years, used to work shift job, was put on  clonazepam 1 mg every night for at least for 3 years now, worked for a while, now even after taking clonazepam 1 mg every night, he has difficulty falling to sleep, reported only sleep couple hours every night, waking up very bothered by his right hand shaking,   PHYSICAL EXAM:   Vitals:   04/14/21 1125  BP: 106/76  Pulse: 88  Weight: 183 lb (83 kg)  Height: 5\' 8"  (1.727 m)   Not recorded     Body mass index is 27.83 kg/m.  PHYSICAL EXAMNIATION:  Gen: NAD, conversant, well nourised, well groomed                     Cardiovascular: Regular rate rhythm, no peripheral edema, warm, nontender. Eyes: Conjunctivae clear without exudates or hemorrhage Neck: Supple, no carotid bruits. Pulmonary: Clear to auscultation bilaterally   NEUROLOGICAL EXAM:  MENTAL STATUS: Speech:    Speech is normal; fluent and spontaneous with normal comprehension.  Cognition:     Orientation to time, place and person     Normal recent and remote memory     Normal Attention span and concentration     Normal Language, naming, repeating,spontaneous speech     Fund of knowledge   CRANIAL NERVES: CN II: Visual fields are full to confrontation. Pupils are round equal and briskly reactive to light. CN III, IV, VI: extraocular movement are normal. No ptosis. CN V: Facial sensation is intact to light touch CN VII: Face is symmetric with normal eye closure  CN VIII:  Hearing is normal to causal conversation. CN IX, X: Phonation is normal. CN XI: Head turning and shoulder shrug are intact  MOTOR: Almost constant right upper extremity resting tremor, right leg shaking, frequent lesser amplitude of left upper extremity shaking, no weakness, right more than left mild rigidity, bradykinesia,  REFLEXES: Reflexes are 2+ and symmetric at the biceps, triceps, knees, and ankles. Plantar responses are flexor.  SENSORY: Intact to light touch, pinprick and vibratory sensation are intact in fingers and  toes.  COORDINATION: There is no trunk or limb dysmetria noted.  GAIT/STANCE: Gait is stable, moderate stride, noticeable right hand shaking, decreased right arm swing, smooth turning, mild retropulsion stability  REVIEW OF SYSTEMS:  Full 14 system review of systems performed and notable only for as above All other review of systems were negative.   ALLERGIES: No Known Allergies  HOME MEDICATIONS: Current Outpatient Medications  Medication Sig Dispense Refill   aspirin EC 81 MG tablet Take 81 mg by mouth daily.     atorvastatin (LIPITOR) 10 MG tablet Take 10 mg by mouth daily.     carvedilol (COREG) 3.125 MG tablet Take 3.125 mg by mouth daily.     clonazePAM (KLONOPIN) 1 MG tablet Take 1 mg by mouth daily as needed for anxiety.     fenofibrate (TRIGLIDE) 50 MG tablet Take 50 mg by mouth daily.     niacin 500 MG tablet Take 500 mg by mouth daily.     omega-3 acid ethyl esters (LOVAZA) 1 G capsule Take 1 g by mouth daily.     oxyCODONE-acetaminophen (PERCOCET) 10-325 MG per tablet Take 1 tablet by mouth every 4 (four) hours as needed for pain.     valsartan-hydrochlorothiazide (DIOVAN-HCT) 320-25 MG per tablet Take 1 tablet by mouth daily.     No current facility-administered medications for this visit.    PAST MEDICAL HISTORY: Past Medical History:  Diagnosis Date   Anxiety    Hypertension     PAST SURGICAL HISTORY: Past Surgical History:  Procedure Laterality Date   NO PAST SURGERIES      FAMILY HISTORY: Family History  Problem Relation Age of Onset   Healthy Mother    Alcohol abuse Father    Tremor Neg Hx    Parkinson's disease Neg Hx     SOCIAL HISTORY: Social History   Socioeconomic History   Marital status: Single    Spouse name: Not on file   Number of children: Not on file   Years of education: Not on file   Highest education level: Not on file  Occupational History   Not on file  Tobacco Use   Smoking status: Never   Smokeless tobacco: Never   Vaping Use   Vaping Use: Never used  Substance and Sexual Activity   Alcohol use: Yes    Alcohol/week: 2.0 standard drinks    Types: 2 Standard drinks or equivalent per week    Comment: 16 oz per week "I'm not really a drinker"   Drug use: No   Sexual activity: Not on file  Other Topics Concern   Not on file  Social History Narrative   Lives at home with sister   Left handed   Caffeine: 1 cup in the morning    Social Determinants of Health   Financial Resource Strain: Not on file  Food Insecurity: Not on file  Transportation Needs: Not on file  Physical Activity: Not on file  Stress: Not on file  Social Connections: Not on  file  Intimate Partner Violence: Not on file      Levert Feinstein, M.D. Ph.D.  [facility] [address] [place], [place] [address] Ph: [phone] Fax: [phone]  CC:  Rinaldo Cloud, MD [phone] [address] [place],  [place] [address]  Rinaldo Cloud, MD

## 2021-04-15 LAB — COMPREHENSIVE METABOLIC PANEL
ALT: 14 IU/L (ref 0–44)
AST: 20 IU/L (ref 0–40)
Albumin/Globulin Ratio: 1.7 (ref 1.2–2.2)
Albumin: 4.7 g/dL (ref 3.8–4.8)
Alkaline Phosphatase: 96 IU/L (ref 44–121)
BUN/Creatinine Ratio: 15 (ref 10–24)
BUN: 27 mg/dL (ref 8–27)
Bilirubin Total: 1.1 mg/dL (ref 0.0–1.2)
CO2: 21 mmol/L (ref 20–29)
Calcium: 10.1 mg/dL (ref 8.6–10.2)
Chloride: 98 mmol/L (ref 96–106)
Creatinine, Ser: 1.81 mg/dL — ABNORMAL HIGH (ref 0.76–1.27)
Globulin, Total: 2.8 g/dL (ref 1.5–4.5)
Glucose: 99 mg/dL (ref 70–99)
Potassium: 4.7 mmol/L (ref 3.5–5.2)
Sodium: 136 mmol/L (ref 134–144)
Total Protein: 7.5 g/dL (ref 6.0–8.5)
eGFR: 40 mL/min/{1.73_m2} — ABNORMAL LOW (ref >59)

## 2021-04-15 LAB — CBC WITH DIFFERENTIAL/PLATELET
Basophils Absolute: 0.1 10*3/uL (ref 0.0–0.2)
Basos: 1 %
EOS (ABSOLUTE): 0.1 10*3/uL (ref 0.0–0.4)
Eos: 1 %
Hematocrit: 50.2 % (ref 37.5–51.0)
Hemoglobin: 17.5 g/dL (ref 13.0–17.7)
Immature Grans (Abs): 0 10*3/uL (ref 0.0–0.1)
Immature Granulocytes: 0 %
Lymphocytes Absolute: 1.5 10*3/uL (ref 0.7–3.1)
Lymphs: 17 %
MCH: 33 pg (ref 26.6–33.0)
MCHC: 34.9 g/dL (ref 31.5–35.7)
MCV: 95 fL (ref 79–97)
Monocytes Absolute: 1 10*3/uL — ABNORMAL HIGH (ref 0.1–0.9)
Monocytes: 11 %
Neutrophils Absolute: 6.5 10*3/uL (ref 1.4–7.0)
Neutrophils: 70 %
Platelets: 264 10*3/uL (ref 150–450)
RBC: 5.3 x10E6/uL (ref 4.14–5.80)
RDW: 11.7 % (ref 11.6–15.4)
WBC: 9.2 10*3/uL (ref 3.4–10.8)

## 2021-04-15 LAB — TSH: TSH: 4.66 u[IU]/mL — ABNORMAL HIGH (ref 0.450–4.500)

## 2021-04-17 ENCOUNTER — Telehealth: Payer: Self-pay | Admitting: Neurology

## 2021-04-17 NOTE — Telephone Encounter (Signed)
Attempted to call patient. Voicemail box is not set up. Will call back later.

## 2021-04-17 NOTE — Telephone Encounter (Signed)
I called the patient and advised of lab results. Pt states Seroquel has helped him to sleep at night, says Sinemet is working "okay". He verbalized understanding and expressed appreciation for the call. All questions answered.

## 2021-04-17 NOTE — Telephone Encounter (Signed)
Please call patient, laboratory evaluation showed slight elevation of TSH, could indicate hypothyroid function,  Elevated creatinine 1.81, indicating abnormal kidney function, with GFR of 40  I have forwarded laboratory evaluation to his primary care Rinaldo Cloud, MD may consider repeat laboratory evaluation at next follow-up with his primary care physician  Also check to see if Seroquel has helped his sleeping, if Sinemet 25/100 mg has helped his tremor

## 2021-05-09 DIAGNOSIS — I10 Essential (primary) hypertension: Secondary | ICD-10-CM | POA: Diagnosis not present

## 2021-05-09 DIAGNOSIS — E039 Hypothyroidism, unspecified: Secondary | ICD-10-CM | POA: Diagnosis not present

## 2021-05-09 DIAGNOSIS — E785 Hyperlipidemia, unspecified: Secondary | ICD-10-CM | POA: Diagnosis not present

## 2021-05-09 DIAGNOSIS — R7303 Prediabetes: Secondary | ICD-10-CM | POA: Diagnosis not present

## 2021-07-13 ENCOUNTER — Ambulatory Visit: Payer: PPO | Admitting: Adult Health

## 2021-07-13 ENCOUNTER — Encounter: Payer: Self-pay | Admitting: Adult Health

## 2021-07-13 VITALS — BP 132/91 | HR 76 | Ht 68.0 in | Wt 190.0 lb

## 2021-07-13 DIAGNOSIS — G4700 Insomnia, unspecified: Secondary | ICD-10-CM

## 2021-07-13 DIAGNOSIS — R251 Tremor, unspecified: Secondary | ICD-10-CM | POA: Diagnosis not present

## 2021-07-13 DIAGNOSIS — G20 Parkinson's disease: Secondary | ICD-10-CM | POA: Diagnosis not present

## 2021-07-13 NOTE — Progress Notes (Signed)
Chief Complaint  Patient presents with   Follow-up    Rm 3 alone Pt is well and stable, states he is having trouble sleeping with tremors. Interested in increasing sleeping medication. Tremors hasn't improved.     ASSESSMENT AND PLAN  Todd [name]. is a 66 y.o. 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  He can increase his Seroquel as previously advised to 50mg  nightly and continue to follow with PCP for management   No further recommendations from our standpoint. Can continue to follow with PCP. If he wishes to proceed with further work-up, we will be more than happy to see him back.       DIAGNOSTIC DATA (LABS, IMAGING, TESTING) - I reviewed patient records, labs, notes, testing and imaging myself where available.   MEDICAL HISTORY:  Todd Todd [name]. is a 66 year old male, seen in request by his primary care physician Dr.   Charolette [name], for evaluation of tremor, difficulty sleeping, initial evaluation was on April 14, 2021.   Update 07/13/2021 JM: Patient returns for 87-month follow-up. His largest concern is in regards to continued tremors which interfere with his sleep.  Tremors did not improve after starting Sinemet (initially started for parkinsonian type features). Reports no benefit for sleep with use of seroquel but did not  increase dose as previously advised by Dr. Krista [name] (on 25mg  nightly per PCP, Dr. Krista [name] recommended increasing to 50 mg nightly).   He is frustrated regarding continued tremors and difficulty sleeping. Reports difficulty initially falling asleep due to his tremors but will wake up in the middle of the night and not be able to fall back asleep. He has not previously had sleep study. He does not wish to be "drugged up" and "being a regular in this office". He is frustrated that we are not able to tell him exactly what is wrong with him. Tremors do not bother him during the day reporting "they are just there and I deal with it".   Dr. Krista [name] was able to come speak to patient regarding his concerns.  Discussed indication for wanting to proceed with imaging such as MRI or DAT scan but unfortunately patient continues to decline but unable to give specific reason. Discussion regarding pursuing sleep study to eval for sleep apnea but he also declines this evaluation.        History provided for reference purposes only Consult visit 04/14/2021 Dr Todd [name]:  I reviewed and summarized the referring note. PMhx HTN HLD Anxiety,   He is retired Medical illustrator, used to be very active, runs 6 miles without any difficulty, since retirement few years ago, moved from [place] to [place], become very sedentary, around 2021, he noticed right hand tremor, per history progress quickly, now involving right leg, also left hand,  He denies gait abnormality, denies loss sense of smell  His main concern is his worsening chronic insomnia, has been  ongoing for many years, used to work shift job, was put on clonazepam 1 mg every night for at least for 3 years now, worked for a while, now even after taking clonazepam 1 mg every night, he has difficulty falling to sleep, reported only sleep couple hours every night, waking up very bothered by his right hand shaking,    PHYSICAL EXAM:   Vitals:   07/13/21 1359  BP: (!)  132/91  Pulse: 76  Weight: 190 lb (86.2 kg)  Height: 5\' 8"  (1.727 m)    Not recorded     Body mass index is 28.89 kg/m.  PHYSICAL EXAMNIATION:  Gen: NAD, conversant, well nourised, well groomed                     Cardiovascular: Regular rate rhythm, no peripheral edema, warm, nontender. Eyes: Conjunctivae clear without exudates or hemorrhage Neck: Supple, no carotid bruits. Pulmonary: Clear to auscultation bilaterally   NEUROLOGICAL EXAM:  MENTAL STATUS: Speech:    Speech is normal; fluent and spontaneous with normal comprehension.  Cognition:     Orientation to time, place and person     Normal recent and remote memory     Normal Attention span and concentration     Normal Language, naming, repeating,spontaneous speech     Fund of knowledge   CRANIAL NERVES: CN II: Visual fields are full to confrontation. Pupils are round equal and briskly reactive to light. CN III, IV, VI: extraocular movement are normal. No ptosis. CN V: Facial sensation is intact to light touch CN VII: Face is symmetric with normal eye closure  CN VIII: Hearing is normal to causal conversation. CN IX, X: Phonation is normal. CN XI: Head turning and shoulder shrug are intact  MOTOR: Almost constant right upper extremity resting tremor, right leg shaking intermittently (but seemed to worsen with increased frustration), frequent lesser amplitude of left upper extremity shaking, no weakness, right more than left mild rigidity, bradykinesia,  REFLEXES: Reflexes are 2+ and symmetric at the biceps, triceps, knees, and ankles. Plantar responses are flexor.  SENSORY: Intact to light touch, pinprick and vibratory sensation are intact in fingers and toes.  COORDINATION: There is no trunk or limb dysmetria noted.  GAIT/STANCE: Gait is stable, moderate stride, noticeable right hand shaking, decreased right arm swing, smooth turning, mild retropulsion instability, no assistive device  REVIEW OF SYSTEMS:   Full 14 system review of systems performed and notable only for as above All other review of systems were negative.   ALLERGIES: No Known Allergies  HOME MEDICATIONS: Current Outpatient Medications  Medication Sig Dispense Refill   aspirin EC 81 MG tablet Take 81 mg by mouth daily.     atorvastatin (LIPITOR) 10 MG tablet Take 10 mg by mouth daily.     carbidopa-levodopa (SINEMET IR) 25-100 MG tablet Take 1 tablet by mouth 3 (three) times daily. 90 tablet 6   carvedilol (COREG) 3.125 MG tablet Take 3.125 mg by mouth daily.     clonazePAM (KLONOPIN) 1 MG tablet Take 1 mg by mouth daily as needed for anxiety.     fenofibrate (TRIGLIDE) 50 MG tablet Take 50 mg by mouth daily.     niacin 500 MG tablet Take 500 mg by mouth daily.     omega-3 acid ethyl esters (LOVAZA) 1 G capsule Take 1 g by mouth daily.     oxyCODONE-acetaminophen (PERCOCET) 10-325 MG per tablet Take 1 tablet by mouth every 4 (four) hours as needed for  pain.     QUEtiapine (SEROQUEL) 25 MG tablet Take 2 tablets (50 mg total) by mouth at bedtime. 60 tablet 6   valsartan-hydrochlorothiazide (DIOVAN-HCT) 320-25 MG per tablet Take 1 tablet by mouth daily.     No current facility-administered medications for this visit.    PAST MEDICAL HISTORY: Past Medical History:  Diagnosis Date   Anxiety    Hypertension     PAST SURGICAL HISTORY: Past Surgical History:  Procedure Laterality Date   NO PAST SURGERIES      FAMILY HISTORY: Family History  Problem Relation Age of Onset   Healthy Mother    Alcohol abuse Father    Tremor Neg Hx    Parkinson's disease Neg Hx     SOCIAL HISTORY: Social History   Socioeconomic History   Marital status: Single    Spouse name: Not on file   Number of children: Not on file   Years of education: Not on file   Highest education level: Not on file  Occupational History   Not on file  Tobacco Use   Smoking status: Never   Smokeless tobacco: Never  Vaping Use   Vaping Use:  Never used  Substance and Sexual Activity   Alcohol use: Yes    Alcohol/week: 2.0 standard drinks    Types: 2 Standard drinks or equivalent per week    Comment: 16 oz per week "I'm not really a drinker"   Drug use: No   Sexual activity: Not on file  Other Topics Concern   Not on file  Social History Narrative   Lives at home with sister   Left handed   Caffeine: 1 cup in the morning    Social Determinants of Health   Financial Resource Strain: Not on file  Food Insecurity: Not on file  Transportation Needs: Not on file  Physical Activity: Not on file  Stress: Not on file  Social Connections: Not on file  Intimate Partner Violence: Not on file     CC:  [place] provider: Dr. Shawnie Pons, MD   I spent 37 minutes of face-to-face and non-face-to-face time with patient assisted by Dr. Krista [name].  This included previsit chart review, lab review, study review, electronic health record documentation, patient education and prolonged discussion regarding tremors, parkinsonian type features and insomnia, indication for further work-up and answered all other questions to patient satisfaction  Frann Rider, [facility]  [facility] [address] [place], [place] [address]  Phone [phone] Fax [phone] Note: This document was prepared with digital dictation and possible smart phrase technology. Any transcriptional errors that result from this process are unintentional.

## 2021-07-18 ENCOUNTER — Encounter: Payer: Self-pay | Admitting: Adult Health

## 2021-07-18 NOTE — Progress Notes (Signed)
Chart reviewed, I also see patients together with Shanda Bumps, the most bothersome symptoms for him is difficulty sleeping, frequent awakening at nighttime, he does mildly overweight, narrow pharyngeal space, constant hand tremor, he is at high risk for obstructive sleep apnea, I have suggested him sleep study for evaluation, also sleep study would clarify if it is truly bilateral hands tremor preventing him from sleeping well, most likely is due to his frequent awakening from other reasons, then he focus on his hands tremor ? ?But patient adamantly refused any evaluation, and attempted treatment suggestions ?

## 2021-08-08 ENCOUNTER — Telehealth: Payer: Self-pay | Admitting: Adult Health

## 2021-08-08 DIAGNOSIS — R7303 Prediabetes: Secondary | ICD-10-CM | POA: Diagnosis not present

## 2021-08-08 DIAGNOSIS — E785 Hyperlipidemia, unspecified: Secondary | ICD-10-CM | POA: Diagnosis not present

## 2021-08-08 DIAGNOSIS — I10 Essential (primary) hypertension: Secondary | ICD-10-CM | POA: Diagnosis not present

## 2021-08-08 DIAGNOSIS — E039 Hypothyroidism, unspecified: Secondary | ICD-10-CM | POA: Diagnosis not present

## 2021-08-08 NOTE — Telephone Encounter (Signed)
This is FYI for POD 3 and Shanda Bumps, NP ?Raynelle Fanning @ Dr Annitta [place] office called requesting a f/u.  When chart message from Shanda Bumps, NP was read phone rep asked Raynelle Fanning the reason for the f/u, she said she didn't know she was just told to schedule an f/u. Phone rep then spoke with Shanda Bumps, NP and was reminded that if it is for a new issue, a referral would be needed.  if pt would like to pursue further testing for tremor we can see him otherwise we dont have anything else to offer.  Raynelle Fanning was called back and that was relayed to her, she then asked phone rep to call pt and relay.  Phone rep called pt and received message that vm is not set up.  The appointment has been cx, because based on initial information given the appointment should not have been made. No call back requested. ?

## 2021-08-08 NOTE — Telephone Encounter (Signed)
Noted. Thank you. At recent visit a couple week ago, patient was seen for tremor but declined any additional work up or treatment. He also c/o insomnia but was advised at initial visit with Dr. Terrace Arabia that this can be further treated by PCP especially as he declined sleep study to rule out sleep apnea at most recent visit. As he is not interested in any work up for tremors, we have nothing else to offer patient. He was advised to call back if he wishes to pursue additional work up at prior visit. Per telephone conversation with PCP office and our phone room, there was no clear indication for requested follow visit.  If patient calls back and would like to pursue further work up for tremors, request f/u visit with Dr. Terrace Arabia to ensure appropriate testing is completed. If PCP wanted patient to be seen for a separate/new issue, a new referral will need to be placed. Thank you.  ?

## 2021-08-24 ENCOUNTER — Ambulatory Visit: Payer: PPO | Admitting: Adult Health

## 2021-11-03 DIAGNOSIS — I10 Essential (primary) hypertension: Secondary | ICD-10-CM | POA: Diagnosis not present

## 2021-11-03 DIAGNOSIS — E039 Hypothyroidism, unspecified: Secondary | ICD-10-CM | POA: Diagnosis not present

## 2021-11-03 DIAGNOSIS — E785 Hyperlipidemia, unspecified: Secondary | ICD-10-CM | POA: Diagnosis not present

## 2021-11-07 DIAGNOSIS — E785 Hyperlipidemia, unspecified: Secondary | ICD-10-CM | POA: Diagnosis not present

## 2021-11-07 DIAGNOSIS — I10 Essential (primary) hypertension: Secondary | ICD-10-CM | POA: Diagnosis not present

## 2021-11-07 DIAGNOSIS — E039 Hypothyroidism, unspecified: Secondary | ICD-10-CM | POA: Diagnosis not present

## 2021-11-07 DIAGNOSIS — R7303 Prediabetes: Secondary | ICD-10-CM | POA: Diagnosis not present

## 2022-01-20 ENCOUNTER — Inpatient Hospital Stay (HOSPITAL_COMMUNITY)
Admission: EM | Admit: 2022-01-20 | Discharge: 2022-02-01 | DRG: 177 | Disposition: A | Discharge status: Home Health | Payer: PPO | Attending: Internal Medicine | Admitting: Internal Medicine

## 2022-01-20 ENCOUNTER — Other Ambulatory Visit: Payer: Self-pay

## 2022-01-20 ENCOUNTER — Encounter (HOSPITAL_COMMUNITY): Payer: Self-pay | Admitting: Emergency Medicine

## 2022-01-20 ENCOUNTER — Emergency Department (HOSPITAL_COMMUNITY): Payer: PPO

## 2022-01-20 DIAGNOSIS — R262 Difficulty in walking, not elsewhere classified: Secondary | ICD-10-CM | POA: Diagnosis not present

## 2022-01-20 DIAGNOSIS — W19XXXA Unspecified fall, initial encounter: Secondary | ICD-10-CM | POA: Diagnosis not present

## 2022-01-20 DIAGNOSIS — J690 Pneumonitis due to inhalation of food and vomit: Secondary | ICD-10-CM | POA: Diagnosis not present

## 2022-01-20 DIAGNOSIS — R410 Disorientation, unspecified: Secondary | ICD-10-CM | POA: Diagnosis present

## 2022-01-20 DIAGNOSIS — R778 Other specified abnormalities of plasma proteins: Secondary | ICD-10-CM | POA: Diagnosis present

## 2022-01-20 DIAGNOSIS — E860 Dehydration: Secondary | ICD-10-CM | POA: Diagnosis present

## 2022-01-20 DIAGNOSIS — Z811 Family history of alcohol abuse and dependence: Secondary | ICD-10-CM | POA: Diagnosis not present

## 2022-01-20 DIAGNOSIS — E782 Mixed hyperlipidemia: Secondary | ICD-10-CM | POA: Diagnosis present

## 2022-01-20 DIAGNOSIS — I959 Hypotension, unspecified: Secondary | ICD-10-CM | POA: Diagnosis not present

## 2022-01-20 DIAGNOSIS — N1832 Chronic kidney disease, stage 3b: Secondary | ICD-10-CM | POA: Diagnosis present

## 2022-01-20 DIAGNOSIS — U071 COVID-19: Secondary | ICD-10-CM | POA: Diagnosis present

## 2022-01-20 DIAGNOSIS — W1830XA Fall on same level, unspecified, initial encounter: Secondary | ICD-10-CM | POA: Diagnosis present

## 2022-01-20 DIAGNOSIS — I129 Hypertensive chronic kidney disease with stage 1 through stage 4 chronic kidney disease, or unspecified chronic kidney disease: Secondary | ICD-10-CM | POA: Diagnosis present

## 2022-01-20 DIAGNOSIS — R4781 Slurred speech: Secondary | ICD-10-CM | POA: Diagnosis present

## 2022-01-20 DIAGNOSIS — G20C Parkinsonism, unspecified: Secondary | ICD-10-CM | POA: Diagnosis present

## 2022-01-20 DIAGNOSIS — M25552 Pain in left hip: Secondary | ICD-10-CM | POA: Diagnosis present

## 2022-01-20 DIAGNOSIS — R9431 Abnormal electrocardiogram [ECG] [EKG]: Secondary | ICD-10-CM | POA: Diagnosis present

## 2022-01-20 DIAGNOSIS — R6510 Systemic inflammatory response syndrome (SIRS) of non-infectious origin without acute organ dysfunction: Secondary | ICD-10-CM | POA: Diagnosis present

## 2022-01-20 DIAGNOSIS — Z7982 Long term (current) use of aspirin: Secondary | ICD-10-CM

## 2022-01-20 DIAGNOSIS — M6282 Rhabdomyolysis: Secondary | ICD-10-CM | POA: Diagnosis present

## 2022-01-20 DIAGNOSIS — N390 Urinary tract infection, site not specified: Secondary | ICD-10-CM | POA: Diagnosis not present

## 2022-01-20 DIAGNOSIS — D72829 Elevated white blood cell count, unspecified: Secondary | ICD-10-CM | POA: Diagnosis not present

## 2022-01-20 DIAGNOSIS — G9341 Metabolic encephalopathy: Secondary | ICD-10-CM | POA: Diagnosis present

## 2022-01-20 DIAGNOSIS — T796XXA Traumatic ischemia of muscle, initial encounter: Secondary | ICD-10-CM

## 2022-01-20 DIAGNOSIS — F05 Delirium due to known physiological condition: Secondary | ICD-10-CM | POA: Diagnosis not present

## 2022-01-20 DIAGNOSIS — G20 Parkinson's disease: Secondary | ICD-10-CM | POA: Diagnosis present

## 2022-01-20 DIAGNOSIS — Z79899 Other long term (current) drug therapy: Secondary | ICD-10-CM

## 2022-01-20 DIAGNOSIS — Y92009 Unspecified place in unspecified non-institutional (private) residence as the place of occurrence of the external cause: Secondary | ICD-10-CM | POA: Diagnosis not present

## 2022-01-20 DIAGNOSIS — B961 Klebsiella pneumoniae [K. pneumoniae] as the cause of diseases classified elsewhere: Secondary | ICD-10-CM | POA: Diagnosis not present

## 2022-01-20 DIAGNOSIS — I6782 Cerebral ischemia: Secondary | ICD-10-CM | POA: Diagnosis not present

## 2022-01-20 DIAGNOSIS — R7989 Other specified abnormal findings of blood chemistry: Secondary | ICD-10-CM | POA: Diagnosis present

## 2022-01-20 DIAGNOSIS — E039 Hypothyroidism, unspecified: Secondary | ICD-10-CM | POA: Diagnosis present

## 2022-01-20 DIAGNOSIS — R531 Weakness: Secondary | ICD-10-CM | POA: Diagnosis not present

## 2022-01-20 DIAGNOSIS — G909 Disorder of the autonomic nervous system, unspecified: Secondary | ICD-10-CM | POA: Diagnosis present

## 2022-01-20 DIAGNOSIS — Z043 Encounter for examination and observation following other accident: Secondary | ICD-10-CM | POA: Diagnosis not present

## 2022-01-20 DIAGNOSIS — R4182 Altered mental status, unspecified: Secondary | ICD-10-CM | POA: Diagnosis not present

## 2022-01-20 DIAGNOSIS — F419 Anxiety disorder, unspecified: Secondary | ICD-10-CM | POA: Diagnosis present

## 2022-01-20 DIAGNOSIS — R7401 Elevation of levels of liver transaminase levels: Secondary | ICD-10-CM | POA: Diagnosis present

## 2022-01-20 DIAGNOSIS — R0602 Shortness of breath: Secondary | ICD-10-CM | POA: Diagnosis not present

## 2022-01-20 DIAGNOSIS — I10 Essential (primary) hypertension: Secondary | ICD-10-CM | POA: Diagnosis not present

## 2022-01-20 LAB — COMPREHENSIVE METABOLIC PANEL
ALT: 31 U/L (ref 0–44)
AST: 78 U/L — ABNORMAL HIGH (ref 15–41)
Albumin: 4 g/dL (ref 3.5–5.0)
Alkaline Phosphatase: 51 U/L (ref 38–126)
Anion gap: 13 (ref 5–15)
BUN: 16 mg/dL (ref 8–23)
CO2: 23 mmol/L (ref 22–32)
Calcium: 8.8 mg/dL — ABNORMAL LOW (ref 8.9–10.3)
Chloride: 99 mmol/L (ref 98–111)
Creatinine, Ser: 1.41 mg/dL — ABNORMAL HIGH (ref 0.61–1.24)
GFR, Estimated: 54 mL/min — ABNORMAL LOW (ref >60)
Glucose, Bld: 101 mg/dL — ABNORMAL HIGH (ref 70–99)
Potassium: 4.3 mmol/L (ref 3.5–5.1)
Sodium: 135 mmol/L (ref 135–145)
Total Bilirubin: 2.1 mg/dL — ABNORMAL HIGH (ref 0.3–1.2)
Total Protein: 6.9 g/dL (ref 6.5–8.1)

## 2022-01-20 LAB — I-STAT VENOUS BLOOD GAS, ED
Acid-base deficit: 4 mmol/L — ABNORMAL HIGH (ref 0.0–2.0)
Bicarbonate: 21.9 mmol/L (ref 20.0–28.0)
Calcium, Ion: 1.06 mmol/L — ABNORMAL LOW (ref 1.15–1.40)
HCT: 47 % (ref 39.0–52.0)
Hemoglobin: 16 g/dL (ref 13.0–17.0)
O2 Saturation: 73 %
Potassium: 3.9 mmol/L (ref 3.5–5.1)
Sodium: 136 mmol/L (ref 135–145)
TCO2: 23 mmol/L (ref 22–32)
pCO2, Ven: 43.5 mmHg — ABNORMAL LOW (ref 44–60)
pH, Ven: 7.31 (ref 7.25–7.43)
pO2, Ven: 42 mmHg (ref 32–45)

## 2022-01-20 LAB — CBC WITH DIFFERENTIAL/PLATELET
Abs Immature Granulocytes: 0.02 10*3/uL (ref 0.00–0.07)
Basophils Absolute: 0 10*3/uL (ref 0.0–0.1)
Basophils Relative: 0 %
Eosinophils Absolute: 0 10*3/uL (ref 0.0–0.5)
Eosinophils Relative: 0 %
HCT: 48.5 % (ref 39.0–52.0)
Hemoglobin: 16.7 g/dL (ref 13.0–17.0)
Immature Granulocytes: 0 %
Lymphocytes Relative: 11 %
Lymphs Abs: 0.7 10*3/uL (ref 0.7–4.0)
MCH: 33.7 pg (ref 26.0–34.0)
MCHC: 34.4 g/dL (ref 30.0–36.0)
MCV: 98 fL (ref 80.0–100.0)
Monocytes Absolute: 1.1 10*3/uL — ABNORMAL HIGH (ref 0.1–1.0)
Monocytes Relative: 19 %
Neutro Abs: 4 10*3/uL (ref 1.7–7.7)
Neutrophils Relative %: 70 %
Platelets: 120 10*3/uL — ABNORMAL LOW (ref 150–400)
RBC: 4.95 MIL/uL (ref 4.22–5.81)
RDW: 11.9 % (ref 11.5–15.5)
WBC: 5.8 10*3/uL (ref 4.0–10.5)
nRBC: 0 % (ref 0.0–0.2)

## 2022-01-20 LAB — LIPASE, BLOOD: Lipase: 29 U/L (ref 11–51)

## 2022-01-20 LAB — URINALYSIS, ROUTINE W REFLEX MICROSCOPIC
Bacteria, UA: NONE SEEN
Bilirubin Urine: NEGATIVE
Glucose, UA: NEGATIVE mg/dL
Ketones, ur: 20 mg/dL — AB
Leukocytes,Ua: NEGATIVE
Nitrite: NEGATIVE
Protein, ur: 30 mg/dL — AB
Specific Gravity, Urine: 1.02 (ref 1.005–1.030)
pH: 5 (ref 5.0–8.0)

## 2022-01-20 LAB — RAPID URINE DRUG SCREEN, HOSP PERFORMED
Amphetamines: NOT DETECTED
Barbiturates: NOT DETECTED
Benzodiazepines: NOT DETECTED
Cocaine: NOT DETECTED
Opiates: NOT DETECTED
Tetrahydrocannabinol: POSITIVE — AB

## 2022-01-20 LAB — TROPONIN I (HIGH SENSITIVITY)
Troponin I (High Sensitivity): 25 ng/L — ABNORMAL HIGH (ref <18)
Troponin I (High Sensitivity): 27 ng/L — ABNORMAL HIGH (ref <18)

## 2022-01-20 LAB — AMMONIA: Ammonia: 29 umol/L (ref 9–35)

## 2022-01-20 LAB — TSH: TSH: 0.857 u[IU]/mL (ref 0.350–4.500)

## 2022-01-20 LAB — CK: Total CK: 1414 U/L — ABNORMAL HIGH (ref 49–397)

## 2022-01-20 MED ORDER — SODIUM CHLORIDE 0.9 % IV BOLUS
1000.0000 mL | Freq: Once | INTRAVENOUS | Status: AC
  Administered 2022-01-20: 1000 mL via INTRAVENOUS

## 2022-01-20 MED ORDER — IOHEXOL 350 MG/ML SOLN
100.0000 mL | Freq: Once | INTRAVENOUS | Status: AC | PRN
  Administered 2022-01-20: 100 mL via INTRAVENOUS

## 2022-01-20 NOTE — ED Notes (Signed)
Family becoming upset over patient care, this RN tried to deescalate and notified EDP.

## 2022-01-20 NOTE — ED Provider Notes (Signed)
[facility] EMERGENCY DEPARTMENT Provider Note   CSN: [id number] Arrival date & time: 01/20/22  1507     History {Add pertinent medical, surgical, social history, OB history to HPI:1} No chief complaint on file.   Todd [name]. is a 66 y.o. male.  Patient as above with significant medical history as below, including anxiety, htn, parkinsonism w/ b/l resting tremor ( under care of neurology Dr Terrace Arabia) who presents to the ED with complaint of fall. Pt had a fall on Thursday while mowing the lawn, pain to left hip proximally; diff ambulation following this. He tried to get out of bed on Friday AM and fell to the ground 2/2 left leg pain. He does not believe that he struck his head or had LOC. Unable to get up from the floor on Friday, family unable to get him up from the floor. Was on the ground for ?24 hours, timeline is unclear. Lives with sister. He reports that he is feeling at his approx baseline other than left hip pain.   No cp, no n/v, no abd pain, no change bowel/bladder fxn, no numbness or tingling that he feels is new, no HA   Past Medical History:  Diagnosis Date   Anxiety    Hypertension     Past Surgical History:  Procedure Laterality Date   NO PAST SURGERIES       No language interpreter was used.       Home Medications Prior to Admission medications   Medication Sig Start Date End Date Taking? Authorizing Provider  aspirin EC 81 MG tablet Take 81 mg by mouth daily.    [provider]  atorvastatin (LIPITOR) 10 MG tablet Take 10 mg by mouth daily.    [provider]  carvedilol (COREG) 3.125 MG tablet Take 3.125 mg by mouth daily.    [provider]  clonazePAM (KLONOPIN) 1 MG tablet Take 1 mg by mouth daily as needed for anxiety.    [provider]  fenofibrate (TRIGLIDE) 50 MG tablet Take 50 mg by mouth daily.    [provider]  niacin 500 MG tablet Take 500 mg by mouth daily.    [provider]  omega-3 acid ethyl esters (LOVAZA) 1 G capsule Take 1 g by mouth daily.    [provider]  oxyCODONE-acetaminophen (PERCOCET) 10-325 MG per tablet Take 1 tablet by mouth every 4 (four) hours as needed for pain.    [provider]  QUEtiapine (SEROQUEL) 25 MG tablet Take 2 tablets (50 mg total) by mouth at bedtime. 04/14/21   Levert Feinstein, MD  valsartan-hydrochlorothiazide (DIOVAN-HCT) 320-25 MG per tablet Take 1 tablet by mouth daily.    [provider]      Allergies    Patient has no known allergies.    Review of Systems   Review of Systems  Constitutional:  Negative for chills and fever.  HENT:  Negative for facial swelling and trouble swallowing.   Eyes:  Negative for photophobia and visual disturbance.  Respiratory:  Negative for cough and shortness of breath.   Cardiovascular:  Negative for chest pain and palpitations.  Gastrointestinal:  Negative for abdominal pain, nausea and vomiting.  Endocrine: Negative for polydipsia and polyuria.  Genitourinary:  Negative for difficulty urinating and hematuria.  Musculoskeletal:  Positive for arthralgias. Negative for gait problem and joint swelling.  Skin:  Negative for pallor and rash.  Neurological:  Positive for tremors. Negative for syncope and headaches.  Psychiatric/Behavioral:  Negative for agitation and confusion.     Physical Exam Updated Vital Signs There were no vitals taken for this visit. Physical Exam Vitals and nursing note reviewed.  Constitutional:      General: He is not in acute distress.    Appearance: He is well-developed.  HENT:     Head: Normocephalic and atraumatic.     Right Ear: External ear normal.     Left Ear: External ear normal.     Mouth/Throat:     Mouth: Mucous membranes are moist.  Eyes:     General: No scleral icterus.    Extraocular Movements: Extraocular movements intact.     Pupils: Pupils are equal, round, and reactive to light.  Cardiovascular:      Rate and Rhythm: Normal rate and regular rhythm.     Pulses: Normal pulses.     Heart sounds: Normal heart sounds.  Pulmonary:     Effort: Pulmonary effort is normal. No respiratory distress.     Breath sounds: Normal breath sounds.  Abdominal:     General: Abdomen is flat.     Palpations: Abdomen is soft.     Tenderness: There is no abdominal tenderness. There is no guarding or rebound.  Musculoskeletal:        General: Normal range of motion.     Cervical back: Normal range of motion.     Right lower leg: No edema.     Left lower leg: No edema.     Comments: No pain to b/l LE w/ log roll 2+ DP equal b/l Cap refill brisk b/l feet   Skin:    General: Skin is warm and dry.     Capillary Refill: Capillary refill takes less than 2 seconds.  Neurological:     Mental Status: He is alert and oriented to person, place, and time.     GCS: GCS eye subscore is 4. GCS verbal subscore is 5. GCS motor subscore is 6.     Cranial Nerves: Cranial nerves 2-12 are intact.     Sensory: Sensation is intact.     Motor: Tremor present.     Coordination: Coordination is intact.     Comments: Gait not tested 2/2 patient safety   Psychiatric:        Mood and Affect: Mood normal.        Behavior: Behavior normal.     ED Results / Procedures / Treatments   Labs (all labs ordered are listed, but only abnormal results are displayed) Labs Reviewed - No data to display  EKG None  Radiology No results found.  Procedures Procedures  {Document cardiac monitor, telemetry assessment procedure when appropriate:1}  Medications Ordered in ED Medications - No data to display  ED Course/ Medical Decision Making/ A&P Clinical Course as of 01/20/22 2237  Sat Jan 20, 2022  1946 Spoke with family at bedside, niece/sister; reports patient has been acting abnormally since Friday morning, LKN was Thursday evening around 6-7 pm. Generalized weakness, slurred speech and intermittent confusion. He has been  compliant with his home medications up until Friday when he had the fall in the AM.  [SG]    Clinical Course User Index [SG] Sloan Leiter, DO                           Medical Decision Making Amount and/or Complexity of Data Reviewed Labs: ordered. Radiology: ordered. ECG/medicine tests: ordered.  Risk Prescription  drug management.   This patient presents to the ED with chief complaint(s) of fall with pertinent past medical history of above which further complicates the presenting complaint. The complaint involves an extensive differential diagnosis and also carries with it a high risk of complications and morbidity.    Differential diagnoses for altered mental status includes but is not exclusive to alcohol, illicit or prescription medications, intracranial pathology such as stroke, intracerebral hemorrhage, fever or infectious causes including sepsis, hypoxemia, uremia, trauma, endocrine related disorders such as diabetes, hypoglycemia, thyroid-related diseases, etc.  . Serious etiologies were considered.   The initial plan is to screening labs/imaging LKN was >24 hours ago, no focal deficit on exam   Additional history obtained: Additional history obtained from family and EMS  Records reviewed [facility] and prior ED notes, home meds, prior labs/iamging    Family reported to the ED later, reports pt LKN was Thursday prior to his fall while using lawn mower, unable to get up off the floor Friday morning and normally he is very active, exercises regularly, performs all of his ADL's. Family is reporting dysarthria but this is not noted on exam.    Independent labs interpretation:  The following labs were independently interpreted:   CPK elevated at 1414, troponin is mildly elevated 25, delta is 27 flat.  No chest pain ongoing.  Creatinine is 1.4, 5 years ago creatinine was 1.25, 6 months ago creatinine was 1.8.  Urinalysis with moderate hemoglobin and  ketones.   Independent visualization of imaging: - I independently visualized the following imaging with scope of interpretation limited to determining acute life threatening conditions related to emergency care: Chest x-ray, hip left x-ray, CT head, CT angio head and neck, MRI brain, which revealed no acute abnormalities  Cardiac monitoring was reviewed and interpreted by myself which shows NSR  Treatment and Reassessment: Given 2 L IV fluid >> Symptoms unchanged  Consultation: - Consulted or discussed management/test interpretation w/ external professional:    Consideration for admission or further workup: Admission was considered    66 yo male, history of resting tremor/parkinsonism to the ED with weakness, mental status changes.  Patient had a fall on Thursday while cutting the grass, has been abnormal since then.  Friday morning had another fall while trying out of bed, unable to get out of bed.  Patient was reported to have slurred speech by family at bedside but this is not appreciated on exam.  There are no focal deficits on neurologic testing, strength equal all 4 extremities.  Physical exam is reassuring.  Resting tremors noted.  Patient unable to ambulate, traumatic rhabdomyolysis, questionable AKI, does appear to have mild dehydration given ketonuria.  Recommend admission to hospitalist service for further evaluation of patient's encephalopathy and generalized weakness  Social Determinants of health: Social History   Tobacco Use   Smoking status: Never   Smokeless tobacco: Never  Vaping Use   Vaping Use: Never used  Substance Use Topics   Alcohol use: Yes    Alcohol/week: 2.0 standard drinks of alcohol    Types: 2 Standard drinks or equivalent per week    Comment: 16 oz per week "I'm not really a drinker"   Drug use: No      {Document critical care time when appropriate:1} {Document review of labs and clinical decision tools ie heart score, Chads2Vasc2 etc:1}   {Document your independent review of radiology images, and any outside records:1} {Document your discussion with family members, caretakers, and with consultants:1} {Document  social determinants of health affecting pt's care:1} {Document your decision making why or why not admission, treatments were needed:1} Final Clinical Impression(s) / ED Diagnoses Final diagnoses:  None    Rx / DC Orders ED Discharge Orders     None

## 2022-01-20 NOTE — ED Triage Notes (Signed)
Pt BIB EMS due to a fall. Pt fell Friday morning out of bed and was unable to get up, family was unable to get pt up and attended to pt on floor until today they decided to call.; Pt is axox4. VSS. No obvious injury.

## 2022-01-20 NOTE — ED Notes (Signed)
Patient transported to MRI 

## 2022-01-21 ENCOUNTER — Encounter (HOSPITAL_COMMUNITY): Payer: Self-pay | Admitting: Internal Medicine

## 2022-01-21 DIAGNOSIS — M6282 Rhabdomyolysis: Secondary | ICD-10-CM | POA: Diagnosis present

## 2022-01-21 DIAGNOSIS — R6510 Systemic inflammatory response syndrome (SIRS) of non-infectious origin without acute organ dysfunction: Secondary | ICD-10-CM | POA: Diagnosis present

## 2022-01-21 DIAGNOSIS — I959 Hypotension, unspecified: Secondary | ICD-10-CM | POA: Diagnosis not present

## 2022-01-21 DIAGNOSIS — E782 Mixed hyperlipidemia: Secondary | ICD-10-CM | POA: Diagnosis present

## 2022-01-21 DIAGNOSIS — R9431 Abnormal electrocardiogram [ECG] [EKG]: Secondary | ICD-10-CM | POA: Diagnosis present

## 2022-01-21 DIAGNOSIS — E039 Hypothyroidism, unspecified: Secondary | ICD-10-CM | POA: Diagnosis present

## 2022-01-21 DIAGNOSIS — R7989 Other specified abnormal findings of blood chemistry: Secondary | ICD-10-CM | POA: Diagnosis present

## 2022-01-21 DIAGNOSIS — G20 Parkinson's disease: Secondary | ICD-10-CM | POA: Diagnosis present

## 2022-01-21 DIAGNOSIS — Y92009 Unspecified place in unspecified non-institutional (private) residence as the place of occurrence of the external cause: Secondary | ICD-10-CM

## 2022-01-21 DIAGNOSIS — R531 Weakness: Secondary | ICD-10-CM

## 2022-01-21 DIAGNOSIS — J690 Pneumonitis due to inhalation of food and vomit: Secondary | ICD-10-CM | POA: Diagnosis not present

## 2022-01-21 DIAGNOSIS — R4781 Slurred speech: Secondary | ICD-10-CM | POA: Diagnosis present

## 2022-01-21 DIAGNOSIS — R410 Disorientation, unspecified: Secondary | ICD-10-CM | POA: Diagnosis present

## 2022-01-21 DIAGNOSIS — R4182 Altered mental status, unspecified: Secondary | ICD-10-CM | POA: Diagnosis not present

## 2022-01-21 DIAGNOSIS — W1830XA Fall on same level, unspecified, initial encounter: Secondary | ICD-10-CM | POA: Diagnosis present

## 2022-01-21 DIAGNOSIS — R778 Other specified abnormalities of plasma proteins: Secondary | ICD-10-CM | POA: Diagnosis present

## 2022-01-21 DIAGNOSIS — F419 Anxiety disorder, unspecified: Secondary | ICD-10-CM | POA: Diagnosis present

## 2022-01-21 DIAGNOSIS — G9341 Metabolic encephalopathy: Secondary | ICD-10-CM | POA: Diagnosis present

## 2022-01-21 DIAGNOSIS — R7401 Elevation of levels of liver transaminase levels: Secondary | ICD-10-CM | POA: Diagnosis present

## 2022-01-21 DIAGNOSIS — D72829 Elevated white blood cell count, unspecified: Secondary | ICD-10-CM | POA: Diagnosis not present

## 2022-01-21 DIAGNOSIS — N1832 Chronic kidney disease, stage 3b: Secondary | ICD-10-CM | POA: Diagnosis present

## 2022-01-21 DIAGNOSIS — N390 Urinary tract infection, site not specified: Secondary | ICD-10-CM | POA: Diagnosis not present

## 2022-01-21 DIAGNOSIS — U071 COVID-19: Secondary | ICD-10-CM | POA: Diagnosis present

## 2022-01-21 DIAGNOSIS — G909 Disorder of the autonomic nervous system, unspecified: Secondary | ICD-10-CM | POA: Diagnosis present

## 2022-01-21 DIAGNOSIS — I10 Essential (primary) hypertension: Secondary | ICD-10-CM | POA: Diagnosis not present

## 2022-01-21 DIAGNOSIS — B961 Klebsiella pneumoniae [K. pneumoniae] as the cause of diseases classified elsewhere: Secondary | ICD-10-CM | POA: Diagnosis not present

## 2022-01-21 DIAGNOSIS — W19XXXA Unspecified fall, initial encounter: Secondary | ICD-10-CM | POA: Diagnosis not present

## 2022-01-21 DIAGNOSIS — E860 Dehydration: Secondary | ICD-10-CM | POA: Diagnosis present

## 2022-01-21 DIAGNOSIS — I129 Hypertensive chronic kidney disease with stage 1 through stage 4 chronic kidney disease, or unspecified chronic kidney disease: Secondary | ICD-10-CM | POA: Diagnosis present

## 2022-01-21 DIAGNOSIS — Z811 Family history of alcohol abuse and dependence: Secondary | ICD-10-CM | POA: Diagnosis not present

## 2022-01-21 DIAGNOSIS — M25552 Pain in left hip: Secondary | ICD-10-CM | POA: Diagnosis present

## 2022-01-21 LAB — COMPREHENSIVE METABOLIC PANEL
ALT: 29 U/L (ref 0–44)
AST: 74 U/L — ABNORMAL HIGH (ref 15–41)
Albumin: 3.5 g/dL (ref 3.5–5.0)
Alkaline Phosphatase: 52 U/L (ref 38–126)
Anion gap: 12 (ref 5–15)
BUN: 14 mg/dL (ref 8–23)
CO2: 20 mmol/L — ABNORMAL LOW (ref 22–32)
Calcium: 7.9 mg/dL — ABNORMAL LOW (ref 8.9–10.3)
Chloride: 103 mmol/L (ref 98–111)
Creatinine, Ser: 1.4 mg/dL — ABNORMAL HIGH (ref 0.61–1.24)
GFR, Estimated: 55 mL/min — ABNORMAL LOW (ref >60)
Glucose, Bld: 90 mg/dL (ref 70–99)
Potassium: 3.5 mmol/L (ref 3.5–5.1)
Sodium: 135 mmol/L (ref 135–145)
Total Bilirubin: 2 mg/dL — ABNORMAL HIGH (ref 0.3–1.2)
Total Protein: 6.6 g/dL (ref 6.5–8.1)

## 2022-01-21 LAB — RESP PANEL BY RT-PCR (FLU A&B, COVID) ARPGX2
Influenza A by PCR: NEGATIVE
Influenza B by PCR: NEGATIVE
SARS Coronavirus 2 by RT PCR: POSITIVE — AB

## 2022-01-21 LAB — C-REACTIVE PROTEIN: CRP: 8.2 mg/dL — ABNORMAL HIGH (ref <1.0)

## 2022-01-21 LAB — CBC WITH DIFFERENTIAL/PLATELET
Abs Immature Granulocytes: 0.01 10*3/uL (ref 0.00–0.07)
Basophils Absolute: 0 10*3/uL (ref 0.0–0.1)
Basophils Relative: 0 %
Eosinophils Absolute: 0 10*3/uL (ref 0.0–0.5)
Eosinophils Relative: 0 %
HCT: 47.7 % (ref 39.0–52.0)
Hemoglobin: 16.3 g/dL (ref 13.0–17.0)
Immature Granulocytes: 0 %
Lymphocytes Relative: 11 %
Lymphs Abs: 0.6 10*3/uL — ABNORMAL LOW (ref 0.7–4.0)
MCH: 33.5 pg (ref 26.0–34.0)
MCHC: 34.2 g/dL (ref 30.0–36.0)
MCV: 98.1 fL (ref 80.0–100.0)
Monocytes Absolute: 0.8 10*3/uL (ref 0.1–1.0)
Monocytes Relative: 14 %
Neutro Abs: 4.1 10*3/uL (ref 1.7–7.7)
Neutrophils Relative %: 75 %
Platelets: 110 10*3/uL — ABNORMAL LOW (ref 150–400)
RBC: 4.86 MIL/uL (ref 4.22–5.81)
RDW: 11.9 % (ref 11.5–15.5)
WBC: 5.5 10*3/uL (ref 4.0–10.5)
nRBC: 0 % (ref 0.0–0.2)

## 2022-01-21 LAB — VITAMIN B12: Vitamin B-12: 516 pg/mL (ref 180–914)

## 2022-01-21 LAB — PROCALCITONIN: Procalcitonin: 0.18 ng/mL

## 2022-01-21 LAB — MAGNESIUM: Magnesium: 1.7 mg/dL (ref 1.7–2.4)

## 2022-01-21 LAB — LACTIC ACID, PLASMA: Lactic Acid, Venous: 1.1 mmol/L (ref 0.5–1.9)

## 2022-01-21 LAB — FOLATE: Folate: >40 ng/mL (ref >5.9)

## 2022-01-21 LAB — RPR: RPR Ser Ql: NONREACTIVE

## 2022-01-21 LAB — MRSA NEXT GEN BY PCR, NASAL: MRSA by PCR Next Gen: NOT DETECTED

## 2022-01-21 LAB — CK: Total CK: 1128 U/L — ABNORMAL HIGH (ref 49–397)

## 2022-01-21 MED ORDER — FENOFIBRATE 54 MG PO TABS
50.0000 mg | ORAL_TABLET | Freq: Every day | ORAL | Status: DC
  Filled 2022-01-21: qty 1

## 2022-01-21 MED ORDER — FOLIC ACID 1 MG PO TABS
1.0000 mg | ORAL_TABLET | Freq: Every day | ORAL | Status: DC
  Administered 2022-01-21 – 2022-02-01 (×12): 1 mg via ORAL
  Filled 2022-01-21 (×12): qty 1

## 2022-01-21 MED ORDER — ACETAMINOPHEN 650 MG RE SUPP: 650.0000 mg | Freq: Four times a day (QID) | RECTAL | Status: DC | PRN

## 2022-01-21 MED ORDER — SODIUM CHLORIDE 0.9 % IV SOLN: INTRAVENOUS | Status: DC

## 2022-01-21 MED ORDER — LORAZEPAM 2 MG/ML IJ SOLN
2.0000 mg | Freq: Once | INTRAMUSCULAR | Status: AC
Start: 2022-01-21 — End: 2022-01-21
  Administered 2022-01-21: 2 mg via INTRAVENOUS
  Filled 2022-01-21: qty 1

## 2022-01-21 MED ORDER — ONDANSETRON HCL 4 MG/2ML IJ SOLN: 4.0000 mg | Freq: Four times a day (QID) | INTRAMUSCULAR | Status: DC | PRN

## 2022-01-21 MED ORDER — POTASSIUM CL IN DEXTROSE 5% 20 MEQ/L IV SOLN
20.0000 meq | INTRAVENOUS | Status: AC
  Administered 2022-01-21 – 2022-01-23 (×4): 20 meq via INTRAVENOUS
  Filled 2022-01-21 (×3): qty 1000

## 2022-01-21 MED ORDER — ONDANSETRON HCL 4 MG PO TABS: 4.0000 mg | ORAL_TABLET | Freq: Four times a day (QID) | ORAL | Status: DC | PRN

## 2022-01-21 MED ORDER — PANTOPRAZOLE SODIUM 40 MG PO TBEC
40.0000 mg | DELAYED_RELEASE_TABLET | Freq: Every day | ORAL | Status: DC
  Administered 2022-01-21 – 2022-02-01 (×12): 40 mg via ORAL
  Filled 2022-01-21 (×12): qty 1

## 2022-01-21 MED ORDER — LORAZEPAM 1 MG PO TABS
1.0000 mg | ORAL_TABLET | ORAL | Status: AC | PRN
  Administered 2022-01-22: 1 mg via ORAL
  Administered 2022-01-23 (×3): 3 mg via ORAL
  Filled 2022-01-21 (×3): qty 3
  Filled 2022-01-21: qty 4
  Filled 2022-01-21: qty 1

## 2022-01-21 MED ORDER — LORAZEPAM 2 MG/ML IJ SOLN
1.0000 mg | INTRAMUSCULAR | Status: AC | PRN
  Administered 2022-01-21 – 2022-01-23 (×4): 2 mg via INTRAVENOUS
  Filled 2022-01-21 (×2): qty 1
  Filled 2022-01-21: qty 2
  Filled 2022-01-21 (×2): qty 1

## 2022-01-21 MED ORDER — MAGNESIUM SULFATE 2 GM/50ML IV SOLN
2.0000 g | Freq: Once | INTRAVENOUS | Status: AC
  Administered 2022-01-21: 2 g via INTRAVENOUS
  Filled 2022-01-21: qty 50

## 2022-01-21 MED ORDER — ADULT MULTIVITAMIN W/MINERALS CH
1.0000 | ORAL_TABLET | Freq: Every day | ORAL | Status: DC
  Administered 2022-01-21 – 2022-02-01 (×12): 1 via ORAL
  Filled 2022-01-21 (×12): qty 1

## 2022-01-21 MED ORDER — HYDRALAZINE HCL 20 MG/ML IJ SOLN
10.0000 mg | Freq: Four times a day (QID) | INTRAMUSCULAR | Status: DC | PRN
  Filled 2022-01-21: qty 1

## 2022-01-21 MED ORDER — CHLORDIAZEPOXIDE HCL 25 MG PO CAPS: 25.0000 mg | ORAL_CAPSULE | Freq: Three times a day (TID) | ORAL | Status: DC

## 2022-01-21 MED ORDER — ENOXAPARIN SODIUM 40 MG/0.4ML IJ SOSY
40.0000 mg | PREFILLED_SYRINGE | INTRAMUSCULAR | Status: DC
  Administered 2022-01-21 – 2022-01-31 (×11): 40 mg via SUBCUTANEOUS
  Filled 2022-01-21 (×11): qty 0.4

## 2022-01-21 MED ORDER — ALBUTEROL SULFATE (2.5 MG/3ML) 0.083% IN NEBU: 2.5000 mg | INHALATION_SOLUTION | RESPIRATORY_TRACT | Status: DC | PRN

## 2022-01-21 MED ORDER — THIAMINE HCL 100 MG/ML IJ SOLN
100.0000 mg | Freq: Every day | INTRAMUSCULAR | Status: DC
  Filled 2022-01-21: qty 2

## 2022-01-21 MED ORDER — CLONIDINE HCL 0.1 MG PO TABS
0.1000 mg | ORAL_TABLET | Freq: Three times a day (TID) | ORAL | Status: DC
  Administered 2022-01-21 – 2022-01-25 (×15): 0.1 mg via ORAL
  Filled 2022-01-21 (×16): qty 1

## 2022-01-21 MED ORDER — QUETIAPINE FUMARATE 25 MG PO TABS: 50.0000 mg | ORAL_TABLET | Freq: Every day | ORAL | Status: DC

## 2022-01-21 MED ORDER — ATORVASTATIN CALCIUM 10 MG PO TABS
10.0000 mg | ORAL_TABLET | Freq: Every day | ORAL | Status: DC
  Administered 2022-01-21 – 2022-01-31 (×11): 10 mg via ORAL
  Filled 2022-01-21 (×11): qty 1

## 2022-01-21 MED ORDER — OMEGA-3-ACID ETHYL ESTERS 1 G PO CAPS
1.0000 g | ORAL_CAPSULE | Freq: Every day | ORAL | Status: DC
Start: 2022-01-21 — End: 2022-02-01
  Administered 2022-01-21 – 2022-02-01 (×12): 1 g via ORAL
  Filled 2022-01-21 (×12): qty 1

## 2022-01-21 MED ORDER — LORAZEPAM 2 MG/ML IJ SOLN
0.0000 mg | Freq: Two times a day (BID) | INTRAMUSCULAR | Status: DC
  Administered 2022-01-24 (×2): 1 mg via INTRAVENOUS
  Filled 2022-01-21: qty 1
  Filled 2022-01-21: qty 2
  Filled 2022-01-21 (×2): qty 1

## 2022-01-21 MED ORDER — THIAMINE MONONITRATE 100 MG PO TABS
100.0000 mg | ORAL_TABLET | Freq: Every day | ORAL | Status: DC
  Administered 2022-01-21 – 2022-02-01 (×12): 100 mg via ORAL
  Filled 2022-01-21 (×12): qty 1

## 2022-01-21 MED ORDER — ACETAMINOPHEN 325 MG PO TABS
650.0000 mg | ORAL_TABLET | Freq: Four times a day (QID) | ORAL | Status: DC | PRN
  Administered 2022-01-26 – 2022-01-27 (×2): 650 mg via ORAL
  Filled 2022-01-21 (×2): qty 2

## 2022-01-21 MED ORDER — ASPIRIN 81 MG PO TBEC
81.0000 mg | DELAYED_RELEASE_TABLET | Freq: Every day | ORAL | Status: DC
  Administered 2022-01-21 – 2022-02-01 (×12): 81 mg via ORAL
  Filled 2022-01-21 (×12): qty 1

## 2022-01-21 MED ORDER — FENOFIBRATE 54 MG PO TABS
54.0000 mg | ORAL_TABLET | Freq: Every day | ORAL | Status: DC
  Administered 2022-01-21 – 2022-01-31 (×11): 54 mg via ORAL
  Filled 2022-01-21 (×11): qty 1

## 2022-01-21 MED ORDER — CARVEDILOL 3.125 MG PO TABS
3.1250 mg | ORAL_TABLET | Freq: Every day | ORAL | Status: DC
  Administered 2022-01-21 – 2022-01-25 (×5): 3.125 mg via ORAL
  Filled 2022-01-21 (×6): qty 1

## 2022-01-21 MED ORDER — LORAZEPAM 2 MG/ML IJ SOLN: 1.0000 mg | Freq: Once | INTRAMUSCULAR | Status: DC

## 2022-01-21 MED ORDER — LORAZEPAM 2 MG/ML IJ SOLN
0.0000 mg | Freq: Four times a day (QID) | INTRAMUSCULAR | Status: DC
  Administered 2022-01-22: 2 mg via INTRAVENOUS
  Filled 2022-01-21: qty 2

## 2022-01-21 MED ORDER — CHLORDIAZEPOXIDE HCL 5 MG PO CAPS
10.0000 mg | ORAL_CAPSULE | Freq: Three times a day (TID) | ORAL | Status: DC
  Filled 2022-01-21 (×2): qty 2

## 2022-01-21 MED ORDER — POTASSIUM CHLORIDE CRYS ER 20 MEQ PO TBCR
40.0000 meq | EXTENDED_RELEASE_TABLET | Freq: Once | ORAL | Status: AC
  Administered 2022-01-21: 40 meq via ORAL
  Filled 2022-01-21: qty 2

## 2022-01-21 MED ORDER — POLYETHYLENE GLYCOL 3350 17 G PO PACK: 17.0000 g | PACK | Freq: Every day | ORAL | Status: DC | PRN

## 2022-01-21 NOTE — ED Notes (Signed)
PT in room with pt. Checked on patient and PT states his IV was out. Patient stated that it came out when he tried rolling over in the bed.

## 2022-01-21 NOTE — Assessment & Plan Note (Signed)
   Follows with Dr. Terrace Arabia in the outpatient setting  Trial of Sinemet was recently discontinued earlier in the year due to being ineffective

## 2022-01-21 NOTE — Assessment & Plan Note (Signed)
·   Please see assessment and plan above °

## 2022-01-21 NOTE — Progress Notes (Signed)
Inpatient Rehab Admissions Coordinator Note:   Per PT patient was screened for CIR candidacy by Estera Ozier Luvenia Starch, CCC-SLP. Noted  COVID + 01/21/22. Patients are eligible to be considered for admission to CIR when cleared from airborne precautions by acute MD or Infectious Disease. Will follow from a distance.   Wolfgang Phoenix, MS, CCC-SLP Admissions Coordinator [phone] 01/21/22 12:43 PM

## 2022-01-21 NOTE — Assessment & Plan Note (Signed)
   Patient fell upon getting out of bed on Friday 9/8  Likely secondary to progressive weakness and confusion  Remainder of work-up as above  PT evaluation ordered

## 2022-01-21 NOTE — Assessment & Plan Note (Signed)
   Notably prolonged QT on admission EKG.  Minimizing QT prolonging agents  Monitoring and correcting electrolytes  Monitoring patient on telemetry

## 2022-01-21 NOTE — ED Notes (Signed)
Pt pulled off male external catheter. Pt slightly agitated and pulling at tubes. This RN and NT will changing pt at this time.

## 2022-01-21 NOTE — Assessment & Plan Note (Signed)
   Patient presenting with extremely vague history of increasing weakness, vague left hip pain inability to ambulate and confusion  Performing extensive work-up for any evidence of infection and [place] chest x-ray, CT imaging of the abdomen pelvis, urinalysis and COVID-19 testing  Concurrently performing extensive encephalopathy evaluation.  Patient is already undergone MRI brain ordered by the emergency department provider that is unremarkable.  Additionally obtaining EEG, TSH, ammonia, VBG, RPR, inflammatory markers, vitamin B12, folate, and toxicology screen  While patient denies heavy alcohol use, he has reported varying amounts of alcohol consumption over the years.  Considering patient's worsening tachycardia and delirium as well as regular use of benzodiazepines in the outpatient setting will place patient on CIWA protocol and tapering regimen of benzodiazepines for now  Hydrating patient with intravenous isotonic fluids  We will adjust treatment regimen based on results of extensive work-up

## 2022-01-21 NOTE — Assessment & Plan Note (Signed)
   Unfortunately patient does not know what medications she is taking  Medication list obtained in epic are as recent as 2015  We will place patient on as needed intravenous hydralazine overnight  Asking for assistance from pharmacy and obtaining and updated medication list  Home antihypertensives will be resumed once able

## 2022-01-21 NOTE — Assessment & Plan Note (Signed)
   Patient presenting after lying down in his bedroom for over 24 hours with an markedly elevated creatinine kinase  There is a small mount of blood on the urinalysis  No significant evidence of associated renal injury  Very mild case of borderline rhabdomyolysis.  Hydrating with patient with intravenous isotonic fluids  Performing serial creatine kinase levels to ensure downtrending and resolution

## 2022-01-21 NOTE — Progress Notes (Addendum)
PROGRESS NOTE                                                                                                                                                                                                             Patient Demographics:    Todd [name], is a 66 y.o. male, DOB - 1955-06-30, EHU:[id number]  Outpatient Primary MD for the patient is Rinaldo Cloud, MD    LOS - 0  Admit date - 01/20/2022    Chief Complaint  Patient presents with   Fall       Brief Narrative (HPI from H&P)  66 year old male with past medical history of hyperlipidemia, hypothyroidism, hypertension and parkinsonism (follows with Dr. Terrace Arabia with Neurology)  who presents to [facility] emergency department via EMS status post fall.  Patient who lives with his sister claims that he mowed his lawn on Friday morning and became dehydrated and weak, subsequently he went inside his house and sustained a fall, according to the sister he was on the floor for 24 hours in fact she made up a bed for him on the floor so that he could sleep, about 12 hours into the fall he started getting little confused, he was subsequently brought to the ER as he was getting confused and was unable to get off the floor.  In the ER CT head and MRI brain were unremarkable, soon after he was seen by the admitting physician around 2 AM he went into what looks like alcohol withdrawal.  He was started on CIWA protocol.  Also his work-up subsequently came back positive for COVID-19 infection, he is currently admitted for dehydration, generalized weakness, metabolic encephalopathy, possible early DTs and COVID-19 infection.   Subjective:    Todd [name] today has, No headache, No chest pain, No abdominal pain - No Nausea, No new weakness tingling or numbness, no SOB, is mildly confused.   Assessment  & Plan :    Dehydration, fall, acute metabolic encephalopathy - patient  presenting with extremely vague history of increasing weakness, vague left hip pain inability to ambulate and confusion after he mowed his lawn and became weak.  He subsequently fell at his home and could not get up for 24 hours.  He also tells me that he drinks 2 drinks of alcohol every day previously he had claimed it was  twice a week, he is also positive for marijuana.  Sister who lives with the patient is unaware of both.  Currently appears dehydrated, has COVID-19 infection, also seems to be in early DTs although that is hard to assess due to his baseline tremors from Parkinson's, he will be hydrated with IV fluids, CT head, CTA head and neck and MRI brain thankfully are unremarkable.  Stable B12, TSH and ammonia levels, continue CIWA protocol along with Librium and monitor.  PT OT to evaluate.  Pending EEG and RPR.   SIRS (systemic inflammatory response syndrome) (HCC) - Patient exhibiting worsening tachypnea and tachycardia throughout the emergency department course, could be due to combination of DTs and dehydration.  But also has COVID-19 infection, thankfully his chest x-ray is stable and he is not hypoxic.  Will monitor clinically with inflammatory markers, add I-S and flutter valve.  Of note he is not vaccinated.   Rhabdomyolysis- Patient presenting after lying down in his bedroom for over 24 hours with an markedly elevated creatinine kinase, due to fall and staying on the floor for 24 hours.  Hydrate with IV fluids.   Elevated troponin level not due myocardial infarction - Slightly elevated serial troponins with flat trajectory of elevation, Patient is chest pain-free, EKG nonacute continue to monitor clinically.  Home dose aspirin, beta-blocker and statin resumed for secondary prevention.  Parkinsonism (HCC) - Follows with Dr. Terrace Arabia in the outpatient setting, Trial of Sinemet was recently discontinued earlier in the year due to being ineffective.  Monitor with supportive care.  Essential  hypertension - placed on Coreg along with low-dose Catapres for DTs and as needed hydralazine.   Mixed hyperlipidemia - on 10 mg of Lipitor daily and fenofibrate 50 mg daily  Prolonged QT interval - keep potassium around 4 and magnesium around 2, Coreg and monitor.  Avoid QT prolonging medications.  CKD 3B.  Baseline creatinine appears to be close to 1.4.  Monitor.       Condition - Extremely Guarded  Family Communication  :  sister Todd [name] [phone] on 01/21/22  Code Status :  Full  Consults  :  None  PUD Prophylaxis : PPI   Procedures  :     CT head, CTA head and neck & MRI brain.  All nonacute.      Disposition Plan  :    Status is: Observation  DVT Prophylaxis  :    enoxaparin (LOVENOX) injection 40 mg Start: 01/21/22 0245   Lab Results  Component Value Date   PLT 110 (L) 01/21/2022    Diet :  Diet Order             DIET SOFT Room service appropriate? Yes; Fluid consistency: Thin  Diet effective now                    Inpatient Medications  Scheduled Meds:  aspirin EC  81 mg Oral Daily   atorvastatin  10 mg Oral Daily   carvedilol  3.125 mg Oral Daily   chlordiazePOXIDE  10 mg Oral TID   cloNIDine  0.1 mg Oral TID   enoxaparin (LOVENOX) injection  40 mg Subcutaneous Q24H   fenofibrate  51 mg Oral Daily   folic acid  1 mg Oral Daily   LORazepam  0-4 mg Intravenous Q6H   Followed by   Melene Muller ON 01/23/2022] LORazepam  0-4 mg Intravenous Q12H   multivitamin with minerals  1 tablet Oral Daily   omega-3 acid ethyl esters  1 g Oral Daily   pantoprazole  40 mg Oral Daily   thiamine  100 mg Oral Daily   Or   thiamine  100 mg Intravenous Daily   Continuous Infusions:  dextrose 5 % with KCl 20 mEq / L     PRN Meds:.acetaminophen **OR** acetaminophen, albuterol, hydrALAZINE, LORazepam **OR** LORazepam, polyethylene glycol  Antibiotics  :    Anti-infectives (From admission, onward)    None        Time Spent in minutes  30   Susa Raring M.D on 01/21/2022 at 8:46 AM  To page go to [email or web address]   [facility] -  Office  [phone]  See all Orders from today for further details    Objective:   Vitals:   01/21/22 0634 01/21/22 0642 01/21/22 0730 01/21/22 0804  BP:  (!) 162/51 (!) 148/91 (!) 148/91  Pulse:  91 94 92  Resp:   (!) 21   Temp: 98.4 F (36.9 C)     TempSrc: Oral     SpO2:   93%     Wt Readings from Last 3 Encounters:  07/13/21 86.2 kg  04/14/21 83 kg  01/04/14 91.5 kg    No intake or output data in the 24 hours ending 01/21/22 0846   Physical Exam  Awake, mildly confused, has upper extremity resting tremors could be due to DTs also has underlying Parkinson's, no new focal weakness, does have generalized weakness and appears dehydrated [place].AT,PERRAL Supple Neck, No JVD,   Symmetrical Chest wall movement, Good air movement bilaterally, CTAB RRR,No Gallops,Rubs or new Murmurs,  +ve B.Sounds, Abd Soft, No tenderness,   No Cyanosis, Clubbing or edema      Data Review:    CBC Recent Labs  Lab 01/20/22 1522 01/20/22 2102 01/21/22 0450  WBC 5.8  --  5.5  HGB 16.7 16.0 16.3  HCT 48.5 47.0 47.7  PLT 120*  --  110*  MCV 98.0  --  98.1  MCH 33.7  --  33.5  MCHC 34.4  --  34.2  RDW 11.9  --  11.9  LYMPHSABS 0.7  --  0.6*  MONOABS 1.1*  --  0.8  EOSABS 0.0  --  0.0  BASOSABS 0.0  --  0.0    Electrolytes Recent Labs  Lab 01/20/22 1522 01/20/22 2034 01/20/22 2102 01/21/22 0450  NA 135  --  136 135  K 4.3  --  3.9 3.5  CL 99  --   --  103  CO2 23  --   --  20*  GLUCOSE 101*  --   --  90  BUN 16  --   --  14  CREATININE 1.41*  --   --  1.40*  CALCIUM 8.8*  --   --  7.9*  AST 78*  --   --  74*  ALT 31  --   --  29  ALKPHOS 51  --   --  52  BILITOT 2.1*  --   --  2.0*  ALBUMIN 4.0  --   --  3.5  MG  --   --   --  1.7  CRP  --   --   --  8.2*  PROCALCITON  --   --   --  0.18  LATICACIDVEN  --   --   --  1.1  TSH  --  0.857  --   --   AMMONIA  --  29  --   --      ------------------------------------------------------------------------------------------------------------------  No results for input(s): "CHOL", "HDL", "LDLCALC", "TRIG", "CHOLHDL", "LDLDIRECT" in the last 72 hours.  No results found for: "HGBA1C"  Recent Labs    01/20/22 2034  TSH 0.857   ------------------------------------------------------------------------------------------------------------------ ID Labs Recent Labs  Lab 01/20/22 1522 01/21/22 0450  WBC 5.8 5.5  PLT 120* 110*  CRP  --  8.2*  PROCALCITON  --  0.18  LATICACIDVEN  --  1.1  CREATININE 1.41* 1.40*    Radiology Reports MR BRAIN WO CONTRAST  Result Date: 01/20/2022 CLINICAL DATA:  Larey SeatFell out of bed, delirium EXAM: MRI HEAD WITHOUT CONTRAST TECHNIQUE: Multiplanar, multiecho pulse sequences of the brain and surrounding structures were obtained without intravenous contrast. COMPARISON:  No prior MRI, correlation is made with CT head and CTA head neck 01/20/2022 FINDINGS: Brain: No restricted diffusion to suggest acute or subacute infarct. No acute hemorrhage, mass, mass effect, or midline shift. No hemosiderin deposition to suggest remote hemorrhage. No hydrocephalus or extra-axial collection. Scattered T2 hyperintense signal in the periventricular white matter, likely the sequela of mild-to-moderate chronic small vessel ischemic disease. Vascular: Normal arterial flow voids. Skull and upper cervical spine: Normal marrow signal. Sinuses/Orbits: Mucous retention cyst in the left maxillary sinus. Mild mucosal thickening in the maxillary sinuses. The orbits are unremarkable. Other: Trace fluid in the right mastoid air cells. IMPRESSION: No acute intracranial process. No evidence of acute or subacute infarct. Electronically Signed   By: Wiliam KeAlison  Vasan M.D.   On: 01/20/2022 22:12   CT ANGIO HEAD NECK W WO CM  Result Date: 01/20/2022 CLINICAL DATA:  Fall EXAM: CT ANGIOGRAPHY HEAD AND NECK TECHNIQUE: Multidetector CT imaging  of the head and neck was performed using the standard protocol during bolus administration of intravenous contrast. Multiplanar CT image reconstructions and MIPs were obtained to evaluate the vascular anatomy. Carotid stenosis measurements (when applicable) are obtained utilizing NASCET criteria, using the distal internal carotid diameter as the denominator. RADIATION DOSE REDUCTION: This exam was performed according to the departmental dose-optimization program which includes automated exposure control, adjustment of the mA and/or kV according to patient size and/or use of iterative reconstruction technique. CONTRAST:  100mL OMNIPAQUE IOHEXOL 350 MG/ML SOLN COMPARISON:  No prior CTA, correlation is made with 01/20/2022 CT head FINDINGS: CT HEAD FINDINGS CT HEAD FINDINGS For noncontrast findings, please see same day CT head. 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 Imaged portion shows no evidence of aneurysm or dissection. No significant stenosis of the arch vessel origins. Right carotid system: No evidence of dissection, occlusion, or hemodynamically significant stenosis (greater than 50%). Left carotid system: No evidence of dissection, occlusion, or hemodynamically significant stenosis (greater than 50%). Retropharyngeal course of the proximal left ICA. Vertebral arteries: No evidence of dissection, occlusion, or hemodynamically significant stenosis (greater than 50%). Skeleton: No acute osseous abnormality. Degenerative changes in the cervical spine Other neck: Negative. Upper chest: No focal pulmonary opacity or pleural effusion. Review of the MIP images confirms the above findings CTA HEAD FINDINGS Anterior circulation: Both internal carotid arteries are patent to the termini, without significant stenosis. A1 segments patent. Normal anterior communicating artery. Anterior cerebral arteries are patent to their distal  aspects. No M1 stenosis or occlusion. MCA branches perfused and symmetric. Posterior circulation: Vertebral arteries patent to the vertebrobasilar junction without stenosis. Basilar patent to its distal aspect. Superior cerebellar arteries patent proximally. Patent P1 segments. PCAs perfused to their distal aspects without stenosis. The left posterior  communicating artery is patent. Venous sinuses: As permitted by contrast timing, patent. Anatomic variants: None significant. Review of the MIP images confirms the above findings IMPRESSION: 1.  No intracranial large vessel occlusion or significant stenosis. 2.  No hemodynamically significant stenosis in the neck. Electronically Signed   By: Wiliam Ke M.D.   On: 01/20/2022 21:44   CT Head Wo Contrast  Result Date: 01/20/2022 CLINICAL DATA:  Fall on Friday and unable to get up. EXAM: CT HEAD WITHOUT CONTRAST TECHNIQUE: Contiguous axial images were obtained from the base of the skull through the vertex without intravenous contrast. RADIATION DOSE REDUCTION: This exam was performed according to the departmental dose-optimization program which includes automated exposure control, adjustment of the mA and/or kV according to patient size and/or use of iterative reconstruction technique. COMPARISON:  None Available. FINDINGS: Brain: Ventricles within normal limits. Mild chronic small vessel ischemic change within the deep periventricular white matter regions bilaterally. No mass, hemorrhage, edema or other evidence of acute parenchymal abnormality. No extra-axial hemorrhage. Vascular: Chronic calcified atherosclerotic changes of the large vessels at the skull base. No unexpected hyperdense vessel. Skull: Normal. Negative for fracture or focal lesion. Sinuses/Orbits: No acute finding. Other: None. IMPRESSION: 1. No acute findings. No intracranial mass, hemorrhage or edema. No skull fracture. 2. Mild chronic small vessel ischemic change in the deep periventricular white  matter regions. Electronically Signed   By: Bary Richard M.D.   On: 01/20/2022 17:30   DG Hip Unilat W or Wo Pelvis 2-3 Views Left  Result Date: 01/20/2022 CLINICAL DATA:  Fall, left hip pain EXAM: DG HIP (WITH OR WITHOUT PELVIS) 2-3V LEFT COMPARISON:  None Available. FINDINGS: There is no evidence of hip fracture or dislocation. There is no evidence of significant arthropathy or other focal bone abnormality. IMPRESSION: Negative. Electronically Signed   By: Duanne Guess D.O.   On: 01/20/2022 16:28   DG Chest 1 View  Result Date: 01/20/2022 CLINICAL DATA:  Fall EXAM: CHEST  1 VIEW COMPARISON:  None Available. FINDINGS: The heart size and mediastinal contours are within normal limits. Both lungs are clear. The visualized skeletal structures are unremarkable. IMPRESSION: No active disease. Electronically Signed   By: Darliss Cheney M.D.   On: 01/20/2022 16:25

## 2022-01-21 NOTE — ED Notes (Signed)
Spoke with MD about this pt. Pt is A/O x3, states he does not remember why he is here. Pt able to tell me his name, DOB, year and day of week. Denies any n/v, tactile/sensory changes, auditory/visual hallucinations, sweaty. When asked about his tremor he states that it is no worse than normal. Pt also reports his last drink was a month ago and he does not drink regularly. Advised MD of this and he gave order to hold CIWA and ativan unless pt gets agitated.

## 2022-01-21 NOTE — Evaluation (Signed)
Physical Therapy Evaluation Patient Details Name: Todd [name] MRN: [id number] DOB: June 23, 1955 Today's Date: 01/21/2022  History of Present Illness  66 y.o. male presents to [facility] hospital on 01/20/2022 s/p 2 falls, experiencing L hip pain, down on the floor for ~24 hours after second fall. Pt admitted for management of rhabdomyolysis, SIRS. PMH includes HLD, HTN, Parkinsonism.  Clinical Impression  Pt presents to PT with significant deficits in functional mobility, gait, balance, power, motor control and planning. Pt with excess extensor tone resulting in posterior lean throughout all sitting and standing activities. Pt requires maxA for all bed mobility and is unable to clear either foot from the floor to step, instead sliding laterally with PT initiating movement of R foot. Pt will benefit from continued aggressive mobilization in an effort to reduce falls risk and to return toward independence. PT recommends AIR admission as the pt was independent prior to this admission and demonstrates the potential to return to independent mobility with high intensity inpatient PT services.       Recommendations for follow up therapy are one component of a multi-disciplinary discharge planning process, led by the attending physician.  Recommendations may be updated based on patient status, additional functional criteria and insurance authorization.  Follow Up Recommendations Acute inpatient rehab (3hours/day)      Assistance Recommended at Discharge Intermittent Supervision/Assistance  Patient can return home with the following  Two people to help with walking and/or transfers;Two people to help with bathing/dressing/bathroom;Assistance with cooking/housework;Assistance with feeding;Direct supervision/assist for medications management;Direct supervision/assist for financial management;Assist for transportation;Help with stairs or ramp for entrance    Equipment Recommendations  (TBD pending progress)   Recommendations for Other Services  Rehab consult    Functional Status Assessment Patient has had a recent decline in their functional status and demonstrates the ability to make significant improvements in function in a reasonable and predictable amount of time.     Precautions / Restrictions Precautions Precautions: Fall Precaution Comments: retropulsive Restrictions Weight Bearing Restrictions: No      Mobility  Bed Mobility Overal bed mobility: Needs Assistance Bed Mobility: Supine to Sit, Sit to Supine     Supine to sit: Max assist Sit to supine: Total assist   General bed mobility comments: strong posterior lean, limited ability to actively move legs at this time, assists to elevate trunk via hand hold from PT    Transfers Overall transfer level: Needs assistance Equipment used: 1 person hand held assist Transfers: Sit to/from Stand Sit to Stand: Min assist, From elevated surface           General transfer comment: posterior lean    Ambulation/Gait Ambulation/Gait assistance: Max assist Gait Distance (Feet): 3 Feet Assistive device: 2 person hand held assist Gait Pattern/deviations: Shuffle Gait velocity: reduced Gait velocity interpretation: <1.31 ft/sec, indicative of household ambulator   General Gait Details: PT assists the pt in sliding laterally to right side toward head of bed, pt unable to lift right leg but does take short shuffling steps with left foot  Stairs            Wheelchair Mobility    Modified Rankin (Stroke Patients Only)       Balance Overall balance assessment: Needs assistance Sitting-balance support: Feet supported, Single extremity supported, Bilateral upper extremity supported Sitting balance-Leahy Scale: Poor Sitting balance - Comments: mod-maxA Postural control: Posterior lean Standing balance support: Bilateral upper extremity supported Standing balance-Leahy Scale: Poor Standing balance comment: min-modA,  posterior lean  Pertinent Vitals/Pain Pain Assessment Pain Assessment: No/denies pain    Home Living Family/patient expects to be discharged to:: Private residence Living Arrangements: Other relatives (sister) Available Help at Discharge: Family;Available PRN/intermittently (sister is retired, possibly 24/7?) Type of Home: House Home Access: Stairs to enter Entrance Stairs-Rails: None Secretary/administrator of Steps: 6   Home Layout: One level Home Equipment: None      Prior Function Prior Level of Function : Independent/Modified Independent;Driving             Mobility Comments: mows the lawn       Hand Dominance        Extremity/Trunk Assessment   Upper Extremity Assessment Upper Extremity Assessment: Generalized weakness;RUE deficits/detail;LUE deficits/detail RUE Deficits / Details: ROM WFL RUE Coordination: decreased fine motor;decreased gross motor LUE Deficits / Details: ROM WFL LUE Coordination: decreased fine motor;decreased gross motor    Lower Extremity Assessment Lower Extremity Assessment: Generalized weakness;RLE deficits/detail;LLE deficits/detail RLE Coordination: decreased fine motor;decreased gross motor LLE Coordination: decreased fine motor;decreased gross motor    Cervical / Trunk Assessment Cervical / Trunk Assessment: Normal  Communication   Communication:  (dysarthric and hypophonic)  Cognition Arousal/Alertness: Awake/alert Behavior During Therapy: Flat affect Overall Cognitive Status: Difficult to assess Area of Impairment: Awareness, Safety/judgement                         Safety/Judgement: Decreased awareness of deficits Awareness: Emergent            General Comments General comments (skin integrity, edema, etc.): VSS on RA, pt with IV pulled out upon PT arrival, RN made aware    Exercises     Assessment/Plan    PT Assessment Patient needs continued PT services   PT Problem List Decreased strength;Decreased activity tolerance;Decreased balance;Decreased mobility;Decreased coordination;Decreased cognition;Decreased knowledge of use of DME;Decreased safety awareness;Decreased knowledge of precautions       PT Treatment Interventions DME instruction;Gait training;Stair training;Functional mobility training;Therapeutic activities;Therapeutic exercise;Balance training;Neuromuscular re-education;Patient/family education;Cognitive remediation    PT Goals (Current goals can be found in the Care Plan section)  Acute Rehab PT Goals Patient Stated Goal: to go home PT Goal Formulation: With patient Time For Goal Achievement: 02/04/22 Potential to Achieve Goals: Fair    Frequency Min 4X/week     Co-evaluation               AM-PAC PT "6 Clicks" Mobility  Outcome Measure Help needed turning from your back to your side while in a flat bed without using bedrails?: A Lot Help needed moving from lying on your back to sitting on the side of a flat bed without using bedrails?: A Lot Help needed moving to and from a bed to a chair (including a wheelchair)?: A Lot Help needed standing up from a chair using your arms (e.g., wheelchair or bedside chair)?: A Lot Help needed to walk in hospital room?: Total Help needed climbing 3-5 steps with a railing? : Total 6 Click Score: 10    End of Session   Activity Tolerance: Patient tolerated treatment well Patient left: in bed;with call bell/phone within reach;with bed alarm set Nurse Communication: Mobility status PT Visit Diagnosis: Other abnormalities of gait and mobility (R26.89);Muscle weakness (generalized) (M62.81);Other symptoms and signs involving the nervous system (R29.898)    Time: 1010-1038 PT Time Calculation (min) (ACUTE ONLY): 28 min   Charges:   PT Evaluation $PT Eval Low Complexity: 1 Low          Zykeria Laguardia  Brita Romp, PT, DPT Acute Rehabilitation Office [phone]   Arlyss Gandy 01/21/2022, 11:20 AM

## 2022-01-21 NOTE — ED Notes (Signed)
Pt removed an IV, when asked he said "it's irritating". Pt also removed tele monitor. This RN placed pt back on monitor. Pt calm and experiencing tremors d/t parkinsons.

## 2022-01-21 NOTE — Progress Notes (Signed)
OT Cancellation Note  Patient Details Name: Halton Neas MRN: [id number] DOB: Aug 12, 1955   Cancelled Treatment:    Reason Eval/Treat Not Completed: Other (comment). Spoke with evaluating PT and he stated it would be easier to work with pt once he is in a normal room/bed due to the amount of A needed. Pt to be admitted per his current RN.  Ignacia Palma, OTR/L Acute Rehab Services [facility] [phone] Office [phone]    Evette Georges 01/21/2022, 2:43 PM

## 2022-01-21 NOTE — H&P (Signed)
History and Physical    Patient: Todd [name] MRN: [id number] DOA: 01/20/2022  Date of Service: the patient was seen and examined on 01/21/2022  Patient coming from: Home via EMS  Chief Complaint:  Chief Complaint  Patient presents with   Fall    HPI:   66 year old male with past medical history of 66 year old male with past medical history of hyperlipidemia, hypothyroidism, hypertension and parkinsonism (follows with Dr. Terrace Arabia with Neurology)  who presents to [facility] emergency department via EMS status post fall.  Patient complains that on Thursday he was mowing the lawn and almost immediately afterwards he began to experience left hip pain.  Left hip pain has been severe, sharp in quality and nonradiating.  Pain has been worse with ambulation.  This led to difficulty with ambulation and on Friday morning after the patient got out of bed he fell to the floor due to difficulty with ambulation and leg pain.  Patient was then lying on the floor and unable to get up for nearly 24 hours.    Upon further questioning patient denies dysuria, shortness of breath, cough, diarrhea sick contacts, recent travel, contact with confirmed COVID-19 infection, chest pain.  Ventually prompting patient family to contact EMS.  Upon prompt EMS arrival patient was brought into [facility] emergency department for evaluation.  Upon evaluation in the emergency department patient was noted to have slightly elevated troponins of 25 and 27.  Patient was also noted to have an elevated creatine kinase of 1400.  CT angiogram of the head neck and MRI brain were unremarkable.  After multiple times in the emergency department patient has been unable to ambulate due to severe weakness and therefore due to severe weakness and possible early rhabdomyolysis the EDP is requesting hospitalization.  Hospitalist group was then called to assess the patient for admission to the hospital.   Review of Systems:  Review of Systems  Musculoskeletal:  Positive for falls.  Neurological:  Positive for weakness.     Past Medical History:  Diagnosis Date   Anxiety    Hypertension     Past Surgical History:  Procedure Laterality Date   NO PAST SURGERIES      Social History:  reports that he has never smoked. He has never used smokeless tobacco. He reports current alcohol use of about 2.0 standard drinks of alcohol per week. He reports that he does not use drugs.  No Known Allergies  Family History  Problem Relation Age of Onset   Healthy Mother    Alcohol abuse Father    Tremor Neg Hx    Parkinson's disease Neg Hx     Prior to Admission medications   Medication Sig Start Date End Date Taking? Authorizing Provider  aspirin EC 81 MG tablet Take 81 mg by mouth daily.    [provider]  atorvastatin (LIPITOR) 10 MG tablet Take 10 mg by mouth daily.    [provider]  carvedilol (COREG) 3.125 MG tablet Take 3.125 mg by mouth daily.    [provider]  clonazePAM (KLONOPIN) 1 MG tablet Take 1 mg by mouth daily as needed for anxiety.    [provider]  fenofibrate (TRIGLIDE) 50 MG tablet Take 50 mg by mouth daily.    [provider]  niacin 500 MG tablet Take 500 mg by mouth daily.    [provider]  omega-3 acid ethyl esters (LOVAZA) 1 G capsule Take 1 g by mouth daily.  [provider]  oxyCODONE-acetaminophen (PERCOCET) 10-325 MG per tablet Take 1 tablet by mouth every 4 (four) hours as needed for pain.    [provider]  QUEtiapine (SEROQUEL) 25 MG tablet Take 2 tablets (50 mg total) by mouth at bedtime. 04/14/21   Levert Feinstein, MD  valsartan-hydrochlorothiazide (DIOVAN-HCT) 320-25 MG per tablet Take 1 tablet by mouth daily.    [provider]    Physical Exam:  Vitals:   01/20/22 2345 01/21/22 0045 01/21/22 0100 01/21/22 0245  BP: (!) 106/95 (!) 147/98 (!) 156/95   Pulse: (!) 102  (!) 108   Resp: (!)  25 (!) 26 (!) 26   Temp:    98.4 F (36.9 C)  TempSrc:    Oral  SpO2: 95%  96%     Constitutional: Agitated, alert and oriented x2. Skin: no rashes, no lesions, poor skin turgor noted. Eyes: Pupils are equally reactive to light.  No evidence of scleral icterus or conjunctival pallor.  ENMT: Dry mucous membranes noted.  Posterior pharynx clear of any exudate or lesions.   Neck: normal, supple, no masses, no thyromegaly.  No evidence of jugular venous distension.   Respiratory: Notable expiratory wheezing heard in the bilateral lower fields without associated rales.. Normal respiratory effort. No accessory muscle use.  Cardiovascular: Tachycardic rate with regular rhythm, no murmurs / rubs / gallops. No extremity edema. 2+ pedal pulses. No carotid bruits.  Chest:   Nontender without crepitus or deformity.   Back:   Nontender without crepitus or deformity. Abdomen: Abdomen is soft and nontender.  No evidence of intra-abdominal masses.  Positive bowel sounds noted in all quadrants.   Musculoskeletal: No joint deformity upper and lower extremities. Good ROM, no contractures. Normal muscle tone.  Neurologic: Significant coarse resting tremors of the bilateral upper extremities.  Agitated and oriented x2.  CN 2-12 grossly intact. Sensation intact.  Patient moving all 4 extremities spontaneously.  Patient is following all commands.  Patient is responsive to verbal stimuli.   Psychiatric: She is visibly confused on and able to fully assess.  Patient currently does not seem to possess insight as to his current situation Data Reviewed:  I have personally reviewed and interpreted labs, imaging.  Significant findings are   Lab Results  Component Value Date   WBC 5.8 01/20/2022   HGB 16.0 01/20/2022   HCT 47.0 01/20/2022   MCV 98.0 01/20/2022   PLT 120 (L) 01/20/2022   Lab Results  Component Value Date   K 3.9 01/20/2022   Lab Results  Component Value Date   BUN 16 01/20/2022   Lab  Results  Component Value Date   CREATININE 1.41 (H) 01/20/2022    CXR:   Chest X-ray was personally reviewed.  No evidence of focal infiltrates.  No evidence of pleural effusion.  No evidence of pneumothorax.    EKG: Personally reviewed.  Rhythm is normal sinus rhythm with heart rate of 98 bpm.  No dynamic ST segment changes appreciated.   Assessment and Plan: * Acute metabolic encephalopathy Patient presenting with extremely vague history of increasing weakness, vague left hip pain inability to ambulate and confusion Performing extensive work-up for any evidence of infection and [place] chest x-ray, CT imaging of the abdomen pelvis, urinalysis and COVID-19 testing Concurrently performing extensive encephalopathy evaluation.  Patient is already undergone MRI brain ordered by the emergency department provider that is unremarkable.  Additionally obtaining EEG, TSH, ammonia, VBG, RPR, inflammatory markers, vitamin B12, folate, and toxicology screen While  patient denies heavy alcohol use, he has reported varying amounts of alcohol consumption over the years. Considering patient's worsening tachycardia and delirium as well as regular use of benzodiazepines in the outpatient setting will place patient on CIWA protocol and tapering regimen of benzodiazepines for now Hydrating patient with intravenous isotonic fluids We will adjust treatment regimen based on results of extensive work-up  SIRS (systemic inflammatory response syndrome) (HCC) Patient exhibiting worsening tachypnea and tachycardia throughout the emergency department course While this may be secondary to an underlying infectious process it is more likely secondary to some sort of drug toxicity or withdrawal Obtaining blood cultures, urinalysis, urine culture And lactic acid and procalcitonin Patient is additionally obtained chest x-ray and CT imaging of the abdomen and pelvis Hydrating patient with intravenous isotonic fluids We will  initiate antibiotics if there is any evidence of infection  Rhabdomyolysis Patient presenting after lying down in his bedroom for over 24 hours with an markedly elevated creatinine kinase There is a small mount of blood on the urinalysis No significant evidence of associated renal injury Very mild case of borderline rhabdomyolysis.  Hydrating with patient with intravenous isotonic fluids Performing serial creatine kinase levels to ensure downtrending and resolution  Elevated troponin level not due myocardial infarction Slightly elevated serial troponins with flat trajectory of elevation Patient is chest pain-free Likely secondary to underlying illness, plaque rupture is unlikely Monitoring patient on telemetry   Fall at home, initial encounter Patient fell upon getting out of bed on Friday 9/8 Likely secondary to progressive weakness and confusion Remainder of work-up as above PT evaluation ordered  Generalized weakness Please see assessment and plan above  Parkinsonism (HCC) Follows with Dr. Terrace Arabia in the outpatient setting Trial of Sinemet was recently discontinued earlier in the year due to being ineffective  Essential hypertension Unfortunately patient does not know what medications she is taking Medication list obtained in epic are as recent as 2015 We will place patient on as needed intravenous hydralazine overnight Asking for assistance from pharmacy and obtaining and updated medication list Home antihypertensives will be resumed once able  Mixed hyperlipidemia Most recent records from 2015 revealed the patient was previously on 10 mg of Lipitor daily and fenofibrate 50 mg daily We will resume updated lipid lowering therapy once we can confirm the regimen.  Prolonged QT interval Notably prolonged QT on admission EKG. Minimizing QT prolonging agents Monitoring and correcting electrolytes Monitoring patient on telemetry       Code Status:  Full code  code status  decision has been confirmed with: nephew at bedside Family Communication: EDP discussed plan of care with spouse.  I personally discussed plan of care with nephew at the bedside.  Consults: None  Severity of Illness:  The appropriate patient status for this patient is OBSERVATION. Observation status is judged to be reasonable and necessary in order to provide the required intensity of service to ensure the patient's safety. The patient's presenting symptoms, physical exam findings, and initial radiographic and laboratory data in the context of their medical condition is felt to place them at decreased risk for further clinical deterioration. Furthermore, it is anticipated that the patient will be medically stable for discharge from the hospital within 2 midnights of admission.   Author:  Marinda Elk MD  01/21/2022 2:48 AM

## 2022-01-21 NOTE — Assessment & Plan Note (Signed)
·   Slightly elevated serial troponins with flat trajectory of elevation °· Patient is chest pain-free °· Likely secondary to underlying illness, plaque rupture is unlikely °· Monitoring patient on telemetry ° ° ° ° ° ° °

## 2022-01-21 NOTE — Assessment & Plan Note (Signed)
   Most recent records from 2015 revealed the patient was previously on 10 mg of Lipitor daily and fenofibrate 50 mg daily  We will resume updated lipid lowering therapy once we can confirm the regimen.

## 2022-01-21 NOTE — Assessment & Plan Note (Signed)
   Patient exhibiting worsening tachypnea and tachycardia throughout the emergency department course  While this may be secondary to an underlying infectious process it is more likely secondary to some sort of drug toxicity or withdrawal  Obtaining blood cultures, urinalysis, urine culture  And lactic acid and procalcitonin  Patient is additionally obtained chest x-ray and CT imaging of the abdomen and pelvis  Hydrating patient with intravenous isotonic fluids  We will initiate antibiotics if there is any evidence of infection

## 2022-01-22 ENCOUNTER — Inpatient Hospital Stay (HOSPITAL_COMMUNITY)
Admit: 2022-01-22 | Discharge: 2022-01-22 | Disposition: A | Discharge status: Home / Self Care | Payer: PPO | Attending: Internal Medicine | Admitting: Internal Medicine

## 2022-01-22 DIAGNOSIS — R4182 Altered mental status, unspecified: Secondary | ICD-10-CM

## 2022-01-22 DIAGNOSIS — G9341 Metabolic encephalopathy: Secondary | ICD-10-CM | POA: Diagnosis not present

## 2022-01-22 LAB — CBC WITH DIFFERENTIAL/PLATELET
Abs Immature Granulocytes: 0.02 10*3/uL (ref 0.00–0.07)
Basophils Absolute: 0 10*3/uL (ref 0.0–0.1)
Basophils Relative: 0 %
Eosinophils Absolute: 0 10*3/uL (ref 0.0–0.5)
Eosinophils Relative: 0 %
HCT: 45.5 % (ref 39.0–52.0)
Hemoglobin: 15.8 g/dL (ref 13.0–17.0)
Immature Granulocytes: 0 %
Lymphocytes Relative: 16 %
Lymphs Abs: 0.9 10*3/uL (ref 0.7–4.0)
MCH: 33.8 pg (ref 26.0–34.0)
MCHC: 34.7 g/dL (ref 30.0–36.0)
MCV: 97.4 fL (ref 80.0–100.0)
Monocytes Absolute: 0.6 10*3/uL (ref 0.1–1.0)
Monocytes Relative: 11 %
Neutro Abs: 4 10*3/uL (ref 1.7–7.7)
Neutrophils Relative %: 73 %
Platelets: 100 10*3/uL — ABNORMAL LOW (ref 150–400)
RBC: 4.67 MIL/uL (ref 4.22–5.81)
RDW: 11.9 % (ref 11.5–15.5)
WBC: 5.5 10*3/uL (ref 4.0–10.5)
nRBC: 0 % (ref 0.0–0.2)

## 2022-01-22 LAB — MAGNESIUM: Magnesium: 2.1 mg/dL (ref 1.7–2.4)

## 2022-01-22 LAB — COMPREHENSIVE METABOLIC PANEL
ALT: 28 U/L (ref 0–44)
AST: 72 U/L — ABNORMAL HIGH (ref 15–41)
Albumin: 3 g/dL — ABNORMAL LOW (ref 3.5–5.0)
Alkaline Phosphatase: 43 U/L (ref 38–126)
Anion gap: 11 (ref 5–15)
BUN: 12 mg/dL (ref 8–23)
CO2: 21 mmol/L — ABNORMAL LOW (ref 22–32)
Calcium: 8.4 mg/dL — ABNORMAL LOW (ref 8.9–10.3)
Chloride: 102 mmol/L (ref 98–111)
Creatinine, Ser: 1.02 mg/dL (ref 0.61–1.24)
GFR, Estimated: >60 mL/min (ref >60)
Glucose, Bld: 97 mg/dL (ref 70–99)
Potassium: 4.3 mmol/L (ref 3.5–5.1)
Sodium: 134 mmol/L — ABNORMAL LOW (ref 135–145)
Total Bilirubin: 1.9 mg/dL — ABNORMAL HIGH (ref 0.3–1.2)
Total Protein: 6 g/dL — ABNORMAL LOW (ref 6.5–8.1)

## 2022-01-22 LAB — HIV ANTIBODY (ROUTINE TESTING W REFLEX): HIV Screen 4th Generation wRfx: NONREACTIVE

## 2022-01-22 LAB — D-DIMER, QUANTITATIVE: D-Dimer, Quant: 1.14 ug/mL-FEU — ABNORMAL HIGH (ref 0.00–0.50)

## 2022-01-22 LAB — C-REACTIVE PROTEIN: CRP: 7.8 mg/dL — ABNORMAL HIGH (ref <1.0)

## 2022-01-22 LAB — BRAIN NATRIURETIC PEPTIDE: B Natriuretic Peptide: 44.3 pg/mL (ref 0.0–100.0)

## 2022-01-22 MED ORDER — DEXAMETHASONE SODIUM PHOSPHATE 10 MG/ML IJ SOLN
6.0000 mg | INTRAMUSCULAR | Status: DC
  Administered 2022-01-22 – 2022-01-25 (×4): 6 mg via INTRAVENOUS
  Filled 2022-01-22 (×6): qty 0.6

## 2022-01-22 MED ORDER — MENTHOL 3 MG MT LOZG
1.0000 | LOZENGE | OROMUCOSAL | Status: DC | PRN
Start: 2022-01-22 — End: 2022-02-01
  Administered 2022-01-22: 3 mg via ORAL
  Filled 2022-01-22: qty 9

## 2022-01-22 NOTE — Progress Notes (Signed)
PROGRESS NOTE                                                                                                                                                                                                             Patient Demographics:    Todd [name], is a 66 y.o. male, DOB - 01-21-1956, KZS:[id number]  Outpatient Primary MD for the patient is Rinaldo Cloud, MD    LOS - 1  Admit date - 01/20/2022    Chief Complaint  Patient presents with   Fall       Brief Narrative (HPI from H&P)  66 year old male with past medical history of hyperlipidemia, hypothyroidism, hypertension and parkinsonism (follows with Dr. Terrace Arabia with Neurology)  who presents to [facility] emergency department via EMS status post fall.  Patient who lives with his sister claims that he mowed his lawn on Friday morning and became dehydrated and weak, subsequently he went inside his house and sustained a fall, according to the sister he was on the floor for 24 hours in fact she made up a bed for him on the floor so that he could sleep, about 12 hours into the fall he started getting little confused, he was subsequently brought to the ER as he was getting confused and was unable to get off the floor.  In the ER CT head and MRI brain were unremarkable, soon after he was seen by the admitting physician around 2 AM he went into what looks like alcohol withdrawal.  He was started on CIWA protocol.  Also his work-up subsequently came back positive for COVID-19 infection, he is currently admitted for dehydration, generalized weakness, metabolic encephalopathy, possible early DTs and COVID-19 infection.   Subjective:   Patient in bed, appears comfortable, denies any headache, no fever, no chest pain or pressure, no shortness of breath , no abdominal pain. No new focal weakness.   Assessment  & Plan :    Dehydration, fall, acute metabolic encephalopathy -  some cute decline likely due to acute COVID-19 infection as well, does have mild inflammation as suggested by borderline D-dimer and CRP, chest x-ray stable and he is not hypoxic, continue to hydrate with IV fluids, trial of IV Decadron, PT OT.  Still extremely weak although eager to go home however reportedly he lives with his sister and she is  sick as well.  Of note he was on the floor for a day at home and was unable to get up and sister was unable to assist him off the floor as well hence we will keep him here with PT OT he is refusing placement.   SIRS (systemic inflammatory response syndrome) (HCC) - likely due to COVID-19 infection, stable after IV fluids continue to monitor.  Kindly see above.   Rhabdomyolysis- Patient presenting after lying down in his bedroom for over 24 hours with an markedly elevated creatinine kinase, due to fall and staying on the floor for 24 hours.  Hydrate with IV fluids.  Elevated troponin level not due myocardial infarction - Slightly elevated serial troponins with flat trajectory of elevation, Patient is chest pain-free, EKG nonacute continue to monitor clinically.  Home dose aspirin, beta-blocker and statin resumed for secondary prevention.  Parkinsonism (HCC) - Follows with Dr. Terrace ArabiaYan in the outpatient setting, Trial of Sinemet was recently discontinued earlier in the year due to being ineffective.  Monitor with supportive care.  Essential hypertension - placed on Coreg along with low-dose Catapres for DTs and as needed hydralazine.   Mixed hyperlipidemia - on 10 mg of Lipitor daily and fenofibrate 50 mg daily  Prolonged QT interval - keep potassium around 4 and magnesium around 2, Coreg and monitor.  Avoid QT prolonging medications.  CKD 3B.  Baseline creatinine appears to be close to 1.4.  Monitor.  History of alcohol use.  Denies any excessive use states he drinks 1 bottle of beer every other day, is little inconsistent but since he is more awake alert today  I doubt he is in DTs and possibly truthful about his alcohol intake.  Continue to monitor.  Monitor on CIWA protocol administer Ativan only if he has agitation as he has resting tremors from Parkinson's.       Condition - Extremely Guarded  Family Communication  :  sister Porfirio MylarCarmen [phone] on 01/21/22, no response on the phone on 01/22/2022  Code Status :  Full  Consults  :  None  PUD Prophylaxis : PPI   Procedures  :     CT head, CTA head and neck & MRI brain.  All nonacute.      Disposition Plan  :    Status is: Inpatient  DVT Prophylaxis  :    enoxaparin (LOVENOX) injection 40 mg Start: 01/21/22 0245   Lab Results  Component Value Date   PLT 100 (L) 01/22/2022    Diet :  Diet Order             DIET SOFT Room service appropriate? Yes; Fluid consistency: Thin  Diet effective now                    Inpatient Medications  Scheduled Meds:  aspirin EC  81 mg Oral Daily   atorvastatin  10 mg Oral Daily   carvedilol  3.125 mg Oral Daily   cloNIDine  0.1 mg Oral TID   enoxaparin (LOVENOX) injection  40 mg Subcutaneous Q24H   fenofibrate  54 mg Oral Daily   folic acid  1 mg Oral Daily   LORazepam  0-4 mg Intravenous Q6H   Followed by   Melene Muller[START ON 01/23/2022] LORazepam  0-4 mg Intravenous Q12H   multivitamin with minerals  1 tablet Oral Daily   omega-3 acid ethyl esters  1 g Oral Daily   pantoprazole  40 mg Oral Daily   thiamine  100 mg  Oral Daily   Or   thiamine  100 mg Intravenous Daily   Continuous Infusions:  dextrose 5 % with KCl 20 mEq / L 20 mEq (01/22/22 0849)   PRN Meds:.acetaminophen **OR** acetaminophen, albuterol, hydrALAZINE, LORazepam **OR** LORazepam, polyethylene glycol  Antibiotics  :    Anti-infectives (From admission, onward)    None        Time Spent in minutes  30   Susa Raring M.D on 01/22/2022 at 11:30 AM  To page go to [email or web address]   [facility] -  Office  [phone]  See all Orders from today for  further details    Objective:   Vitals:   01/21/22 1602 01/21/22 2120 01/22/22 0555 01/22/22 0835  BP: (!) 120/93 123/86 (!) 123/90   Pulse: 72 73 73 73  Resp: 19 18 17    Temp: 98.9 F (37.2 C) 98.4 F (36.9 C) 98.5 F (36.9 C)   TempSrc:      SpO2: 94% 94% 97%   Weight: 80.9 kg     Height: 5\' 8"  (1.727 m)       Wt Readings from Last 3 Encounters:  01/21/22 80.9 kg  07/13/21 86.2 kg  04/14/21 83 kg     Intake/Output Summary (Last 24 hours) at 01/22/2022 1130 Last data filed at 01/22/2022 03/24/2022 Gross per 24 hour  Intake 1002.94 ml  Output 600 ml  Net 402.94 ml     Physical Exam  Awake Alert, less confused this morning no focal deficits, overall quite weak, baseline tremors due to his Parkinson's Morgan.AT,PERRAL Supple Neck, No JVD,   Symmetrical Chest wall movement, Good air movement bilaterally, CTAB RRR,No Gallops, Rubs or new Murmurs,  +ve B.Sounds, Abd Soft, No tenderness,   No Cyanosis, Clubbing or edema     Data Review:    CBC Recent Labs  Lab 01/20/22 1522 01/20/22 2102 01/21/22 0450 01/22/22 0631  WBC 5.8  --  5.5 5.5  HGB 16.7 16.0 16.3 15.8  HCT 48.5 47.0 47.7 45.5  PLT 120*  --  110* 100*  MCV 98.0  --  98.1 97.4  MCH 33.7  --  33.5 33.8  MCHC 34.4  --  34.2 34.7  RDW 11.9  --  11.9 11.9  LYMPHSABS 0.7  --  0.6* 0.9  MONOABS 1.1*  --  0.8 0.6  EOSABS 0.0  --  0.0 0.0  BASOSABS 0.0  --  0.0 0.0    Electrolytes Recent Labs  Lab 01/20/22 1522 01/20/22 2034 01/20/22 2102 01/21/22 0450 01/22/22 0631  NA 135  --  136 135 134*  K 4.3  --  3.9 3.5 4.3  CL 99  --   --  103 102  CO2 23  --   --  20* 21*  GLUCOSE 101*  --   --  90 97  BUN 16  --   --  14 12  CREATININE 1.41*  --   --  1.40* 1.02  CALCIUM 8.8*  --   --  7.9* 8.4*  AST 78*  --   --  74* 72*  ALT 31  --   --  29 28  ALKPHOS 51  --   --  52 43  BILITOT 2.1*  --   --  2.0* 1.9*  ALBUMIN 4.0  --   --  3.5 3.0*  MG  --   --   --  1.7 2.1  CRP  --   --   --  8.2* 7.8*  DDIMER  --   --   --   --  1.14*  PROCALCITON  --   --   --  0.18  --   LATICACIDVEN  --   --   --  1.1  --   TSH  --  0.857  --   --   --   AMMONIA  --  29  --   --   --   BNP  --   --   --   --  44.3    ------------------------------------------------------------------------------------------------------------------ No results for input(s): "CHOL", "HDL", "LDLCALC", "TRIG", "CHOLHDL", "LDLDIRECT" in the last 72 hours.  No results found for: "HGBA1C"  Recent Labs    01/20/22 2034  TSH 0.857   ------------------------------------------------------------------------------------------------------------------ ID Labs Recent Labs  Lab 01/20/22 1522 01/21/22 0450 01/22/22 0631  WBC 5.8 5.5 5.5  PLT 120* 110* 100*  CRP  --  8.2* 7.8*  DDIMER  --   --  1.14*  PROCALCITON  --  0.18  --   LATICACIDVEN  --  1.1  --   CREATININE 1.41* 1.40* 1.02    Radiology Reports MR BRAIN WO CONTRAST  Result Date: 01/20/2022 CLINICAL DATA:  Larey Seat out of bed, delirium EXAM: MRI HEAD WITHOUT CONTRAST TECHNIQUE: Multiplanar, multiecho pulse sequences of the brain and surrounding structures were obtained without intravenous contrast. COMPARISON:  No prior MRI, correlation is made with CT head and CTA head neck 01/20/2022 FINDINGS: Brain: No restricted diffusion to suggest acute or subacute infarct. No acute hemorrhage, mass, mass effect, or midline shift. No hemosiderin deposition to suggest remote hemorrhage. No hydrocephalus or extra-axial collection. Scattered T2 hyperintense signal in the periventricular white matter, likely the sequela of mild-to-moderate chronic small vessel ischemic disease. Vascular: Normal arterial flow voids. Skull and upper cervical spine: Normal marrow signal. Sinuses/Orbits: Mucous retention cyst in the left maxillary sinus. Mild mucosal thickening in the maxillary sinuses. The orbits are unremarkable. Other: Trace fluid in the right mastoid air cells. IMPRESSION: No acute  intracranial process. No evidence of acute or subacute infarct. Electronically Signed   By: Wiliam Ke M.D.   On: 01/20/2022 22:12   CT ANGIO HEAD NECK W WO CM  Result Date: 01/20/2022 CLINICAL DATA:  Fall EXAM: CT ANGIOGRAPHY HEAD AND NECK TECHNIQUE: Multidetector CT imaging of the head and neck was performed using the standard protocol during bolus administration of intravenous contrast. Multiplanar CT image reconstructions and MIPs were obtained to evaluate the vascular anatomy. Carotid stenosis measurements (when applicable) are obtained utilizing NASCET criteria, using the distal internal carotid diameter as the denominator. RADIATION DOSE REDUCTION: This exam was performed according to the departmental dose-optimization program which includes automated exposure control, adjustment of the mA and/or kV according to patient size and/or use of iterative reconstruction technique. CONTRAST:  OMNIPAQUE IOHEXOL 350 MG/ML SOLN COMPARISON:  No prior CTA, correlation is made with 01/20/2022 CT head FINDINGS: CT HEAD FINDINGS CT HEAD FINDINGS For noncontrast findings, please see same day CT head. 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 Imaged portion shows no evidence of aneurysm or dissection. No significant stenosis of the arch vessel origins. Right carotid system: No evidence of dissection, occlusion, or hemodynamically significant stenosis (greater than 50%). Left carotid system: No evidence of dissection, occlusion, or hemodynamically significant stenosis (greater than 50%). Retropharyngeal course of the proximal left ICA. Vertebral arteries: No evidence of dissection, occlusion, or hemodynamically significant stenosis (greater  than 50%). Skeleton: No acute osseous abnormality. Degenerative changes in the cervical spine Other neck: Negative. Upper chest: No focal pulmonary opacity or pleural effusion. Review of the  MIP images confirms the above findings CTA HEAD FINDINGS Anterior circulation: Both internal carotid arteries are patent to the termini, without significant stenosis. A1 segments patent. Normal anterior communicating artery. Anterior cerebral arteries are patent to their distal aspects. No M1 stenosis or occlusion. MCA branches perfused and symmetric. Posterior circulation: Vertebral arteries patent to the vertebrobasilar junction without stenosis. Basilar patent to its distal aspect. Superior cerebellar arteries patent proximally. Patent P1 segments. PCAs perfused to their distal aspects without stenosis. The left posterior communicating artery is patent. Venous sinuses: As permitted by contrast timing, patent. Anatomic variants: None significant. Review of the MIP images confirms the above findings IMPRESSION: 1.  No intracranial large vessel occlusion or significant stenosis. 2.  No hemodynamically significant stenosis in the neck. Electronically Signed   By: Wiliam Ke M.D.   On: 01/20/2022 21:44   CT Head Wo Contrast  Result Date: 01/20/2022 CLINICAL DATA:  Fall on Friday and unable to get up. EXAM: CT HEAD WITHOUT CONTRAST TECHNIQUE: Contiguous axial images were obtained from the base of the skull through the vertex without intravenous contrast. RADIATION DOSE REDUCTION: This exam was performed according to the departmental dose-optimization program which includes automated exposure control, adjustment of the mA and/or kV according to patient size and/or use of iterative reconstruction technique. COMPARISON:  None Available. FINDINGS: Brain: Ventricles within normal limits. Mild chronic small vessel ischemic change within the deep periventricular white matter regions bilaterally. No mass, hemorrhage, edema or other evidence of acute parenchymal abnormality. No extra-axial hemorrhage. Vascular: Chronic calcified atherosclerotic changes of the large vessels at the skull base. No unexpected hyperdense  vessel. Skull: Normal. Negative for fracture or focal lesion. Sinuses/Orbits: No acute finding. Other: None. IMPRESSION: 1. No acute findings. No intracranial mass, hemorrhage or edema. No skull fracture. 2. Mild chronic small vessel ischemic change in the deep periventricular white matter regions. Electronically Signed   By: Bary Richard M.D.   On: 01/20/2022 17:30   DG Hip Unilat W or Wo Pelvis 2-3 Views Left  Result Date: 01/20/2022 CLINICAL DATA:  Fall, left hip pain EXAM: DG HIP (WITH OR WITHOUT PELVIS) 2-3V LEFT COMPARISON:  None Available. FINDINGS: There is no evidence of hip fracture or dislocation. There is no evidence of significant arthropathy or other focal bone abnormality. IMPRESSION: Negative. Electronically Signed   By: Duanne Guess D.O.   On: 01/20/2022 16:28   DG Chest 1 View  Result Date: 01/20/2022 CLINICAL DATA:  Fall EXAM: CHEST  1 VIEW COMPARISON:  None Available. FINDINGS: The heart size and mediastinal contours are within normal limits. Both lungs are clear. The visualized skeletal structures are unremarkable. IMPRESSION: No active disease. Electronically Signed   By: Darliss Cheney M.D.   On: 01/20/2022 16:25

## 2022-01-22 NOTE — Progress Notes (Signed)
Conversation with family and this confusion and inability to walk is new.  Prior was ambulating independently and had no confusion.  Nephew is bedside and spoke to sister on the phone.

## 2022-01-22 NOTE — Progress Notes (Signed)
EEG complete - results pending 

## 2022-01-22 NOTE — Procedures (Signed)
Patient Name: Davey Limas  MRN: [id number]  Epilepsy Attending: Charlsie Quest  Referring Physician/Provider: Marinda Elk, MD  Date: 01/22/2022 Duration: 27.39 mins  Patient history: 66yo M with ams. EEG to evaluate for seizure  Level of alertness: Awake  AEDs during EEG study: None  Technical aspects: This EEG study was done with scalp electrodes positioned according to the 10-20 International system of electrode placement. Electrical activity was reviewed with band pass filter of 1-70Hz , sensitivity of 7 uV/mm, display speed of 19mm/sec with a 60Hz  notched filter applied as appropriate. EEG data were recorded continuously and digitally stored.  Video monitoring was available and reviewed as appropriate.  Description: The posterior dominant rhythm consists of 8 Hz activity of moderate voltage (25-35 uV) seen predominantly in posterior head regions, symmetric and reactive to eye opening and eye closing. Hyperventilation and photic stimulation were not performed.     IMPRESSION: This study is within normal limits. No seizures or epileptiform discharges were seen throughout the recording.  A normal interictal EEG does not exclude nor support the diagnosis of epilepsy.   Naylene Foell 

## 2022-01-22 NOTE — Progress Notes (Signed)
Physical Therapy Treatment Patient Details Name: Todd [name] MRN: [id number] DOB: December 29, 1955 Today's Date: 01/22/2022   History of Present Illness 66 y.o. male presents to [facility] hospital on 01/20/2022 s/p 2 falls, experiencing L hip pain, down on the floor for ~24 hours after second fall. Pt admitted for management of rhabdomyolysis, SIRS. PMH includes HLD, HTN, Parkinsonism.    PT Comments    Patient unable to state where he is, why he is here, and does not recall information after provided to him. Does not recall falling and being on the floor at home. Not reliable for providing information on DME he has at home but ?describing a rollator, but then states he doesn't normally use a device. Currently required +2 mod assist to ambulate 12 ft in room with shuffling gait and anterior imbalance (torso gets ahead of his feet). Attempted to call sister with someone else answering phone (stated Evette's mom??). Patient not a candidate for AIR due to COVID precautions x 10 days. Will need HHPT when able to walk without physical assist (per RN, sister called this a.m. and is sick herself--doubtful she can provide pt physical assist).     Recommendations for follow up therapy are one component of a multi-disciplinary discharge planning process, led by the attending physician.  Recommendations may be updated based on patient status, additional functional criteria and insurance authorization.  Follow Up Recommendations  Home health PT (will need several days to improve prior to discharge home)     Assistance Recommended at Discharge Frequent or constant Supervision/Assistance  Patient can return home with the following Two people to help with walking and/or transfers;Two people to help with bathing/dressing/bathroom;Assistance with cooking/housework;Assistance with feeding;Direct supervision/assist for medications management;Direct supervision/assist for financial management;Assist for transportation;Help  with stairs or ramp for entrance   Equipment Recommendations   (TBD pending progress and what he already owns ?has rollator)    Recommendations for Other Services       Precautions / Restrictions Precautions Precautions: Fall Precaution Comments: retropulsive Restrictions Weight Bearing Restrictions: No     Mobility  Bed Mobility Overal bed mobility: Needs Assistance Bed Mobility: Supine to Sit, Sit to Supine     Supine to sit: Mod assist Sit to supine: Min assist   General bed mobility comments: sitting with posterior lean and min assist    Transfers Overall transfer level: Needs assistance Equipment used: Rolling walker (2 wheels) Transfers: Sit to/from Stand Sit to Stand: Min assist, From elevated surface           General transfer comment: posterior lean    Ambulation/Gait Ambulation/Gait assistance: Mod assist, +2 physical assistance Gait Distance (Feet): 12 Feet Assistive device: Rolling walker (2 wheels) Gait Pattern/deviations: Shuffle Gait velocity: reduced Gait velocity interpretation: <1.31 ft/sec, indicative of household ambulator   General Gait Details: Dragging RLE; requires assist to move RW and for balance due to anterior imbalance as feet shuffle behind his center of balance   Stairs             Wheelchair Mobility    Modified Rankin (Stroke Patients Only)       Balance Overall balance assessment: Needs assistance Sitting-balance support: Feet supported, Single extremity supported, Bilateral upper extremity supported Sitting balance-Leahy Scale: Poor   Postural control: Posterior lean Standing balance support: Bilateral upper extremity supported Standing balance-Leahy Scale: Poor Standing balance comment: min assist  Cognition Arousal/Alertness: Awake/alert Behavior During Therapy: WFL for tasks assessed/performed Overall Cognitive Status: No family/caregiver present to determine  baseline cognitive functioning Area of Impairment: Awareness, Safety/judgement, Orientation, Memory, Following commands                 Orientation Level: Place, Time, Situation   Memory: Decreased short-term memory Following Commands: Follows one step commands inconsistently Safety/Judgement: Decreased awareness of deficits Awareness: Emergent            Exercises      General Comments        Pertinent Vitals/Pain Pain Assessment Pain Assessment: No/denies pain    Home Living                          Prior Function            PT Goals (current goals can now be found in the care plan section) Acute Rehab PT Goals Patient Stated Goal: to go home Time For Goal Achievement: 02/04/22 Potential to Achieve Goals: Fair Progress towards PT goals: Progressing toward goals    Frequency    Min 4X/week      PT Plan Discharge plan needs to be updated    Co-evaluation              AM-PAC PT "6 Clicks" Mobility   Outcome Measure  Help needed turning from your back to your side while in a flat bed without using bedrails?: A Lot Help needed moving from lying on your back to sitting on the side of a flat bed without using bedrails?: A Lot Help needed moving to and from a bed to a chair (including a wheelchair)?: A Lot Help needed standing up from a chair using your arms (e.g., wheelchair or bedside chair)?: A Lot Help needed to walk in hospital room?: Total Help needed climbing 3-5 steps with a railing? : Total 6 Click Score: 10    End of Session   Activity Tolerance: Patient tolerated treatment well Patient left: in bed;with call bell/phone within reach;with bed alarm set (chair like position) Nurse Communication: Mobility status;Other (comment) (not ready for discharge) PT Visit Diagnosis: Other abnormalities of gait and mobility (R26.89);Muscle weakness (generalized) (M62.81);Other symptoms and signs involving the nervous system (R29.898)      Time: 1034-1100 PT Time Calculation (min) (ACUTE ONLY): 26 min  Charges:  $Gait Training: 8-22 mins                      Jerolyn Center, PT Acute Rehabilitation Services  Office [phone]    Zena Amos 01/22/2022, 11:29 AM

## 2022-01-22 NOTE — Evaluation (Signed)
Occupational Therapy Evaluation Patient Details Name: Todd [name] MRN: [id number] DOB: 08/29/1955 Today's Date: 01/22/2022   History of Present Illness 66 y.o. male presents to [facility] hospital on 01/20/2022 s/p 2 falls, experiencing L hip pain, down on the floor for ~24 hours after second fall. + COVID 19 and possible alcohol withdrawal. Pt admitted for management of rhabdomyolysis, SIRS. PMH includes HLD, HTN, Parkinsonism.   Clinical Impression   Per pt and chart review, pt lives with his sister and typically functions independently, including mowing the lawn. Pt states he has a "4 wheeler" when shown a rolling walker, but does not use it. Pt presents with significant cognitive impairment, generalized weakness, mild tremor and poor sitting and standing tolerance. He needs min to total assist for ADLs and ambulated in his room with RW and +2 mod assist. Unclear what level of assistance he will have at home. Anticipate pt may be able to return home with a few more days in the hospital.   Recommendations for follow up therapy are one component of a multi-disciplinary discharge planning process, led by the attending physician.  Recommendations may be updated based on patient status, additional functional criteria and insurance authorization.   Follow Up Recommendations  Home health OT    Assistance Recommended at Discharge Frequent or constant Supervision/Assistance  Patient can return home with the following A lot of help with walking and/or transfers;A lot of help with bathing/dressing/bathroom;Direct supervision/assist for medications management;Assistance with feeding;Assistance with cooking/housework;Direct supervision/assist for financial management;Assist for transportation;Help with stairs or ramp for entrance    Functional Status Assessment  Patient has had a recent decline in their functional status and demonstrates the ability to make significant improvements in function in a reasonable  and predictable amount of time.  Equipment Recommendations  Other (comment) (to be determined)    Recommendations for Other Services       Precautions / Restrictions Precautions Precautions: Fall Precaution Comments: retropulsive Restrictions Weight Bearing Restrictions: No      Mobility Bed Mobility Overal bed mobility: Needs Assistance Bed Mobility: Supine to Sit, Sit to Supine     Supine to sit: Mod assist Sit to supine: Min assist   General bed mobility comments: sitting with posterior lean and min assist    Transfers Overall transfer level: Needs assistance Equipment used: Rolling walker (2 wheels) Transfers: Sit to/from Stand Sit to Stand: Min assist, From elevated surface           General transfer comment: posterior lean      Balance Overall balance assessment: Needs assistance Sitting-balance support: Feet supported, Single extremity supported, Bilateral upper extremity supported Sitting balance-Leahy Scale: Poor   Postural control: Posterior lean Standing balance support: Bilateral upper extremity supported Standing balance-Leahy Scale: Poor Standing balance comment: min assist                           ADL either performed or assessed with clinical judgement   ADL Overall ADL's : Needs assistance/impaired Eating/Feeding: Minimal assistance;Bed level   Grooming: Min guard;Sitting   Upper Body Bathing: Moderate assistance;Sitting   Lower Body Bathing: Maximal assistance;Sit to/from stand   Upper Body Dressing : Moderate assistance;Sitting   Lower Body Dressing: Total assistance;Sit to/from stand   Toilet Transfer: +2 for physical assistance;Moderate assistance;Ambulation;Rolling walker (2 wheels)   Toileting- Clothing Manipulation and Hygiene: Total assistance;Sit to/from stand       Functional mobility during ADLs: Moderate assistance;+2 for physical assistance;Rolling walker (2 wheels)  Vision Ability to See in  Adequate Light: 0 Adequate Patient Visual Report: No change from baseline       Perception     Praxis      Pertinent Vitals/Pain Pain Assessment Pain Assessment: No/denies pain     Hand Dominance Right   Extremity/Trunk Assessment Upper Extremity Assessment Upper Extremity Assessment: RUE deficits/detail;LUE deficits/detail RUE Deficits / Details: mild tremor RUE Coordination: decreased fine motor LUE Deficits / Details: mild tremor LUE Coordination: decreased fine motor   Lower Extremity Assessment Lower Extremity Assessment: Defer to PT evaluation   Cervical / Trunk Assessment Cervical / Trunk Assessment: Normal   Communication Communication Communication: No difficulties   Cognition Arousal/Alertness: Awake/alert Behavior During Therapy: WFL for tasks assessed/performed Overall Cognitive Status: No family/caregiver present to determine baseline cognitive functioning Area of Impairment: Orientation, Memory, Following commands, Safety/judgement, Awareness                 Orientation Level: Place, Time, Situation   Memory: Decreased short-term memory Following Commands: Follows one step commands inconsistently Safety/Judgement: Decreased awareness of deficits Awareness: Emergent         General Comments       Exercises     Shoulder Instructions      Home Living Family/patient expects to be discharged to:: Private residence Living Arrangements: Other relatives (sister) Available Help at Discharge: Family Type of Home: House Home Access: Stairs to enter Secretary/administrator of Steps: 6 Entrance Stairs-Rails: None Home Layout: One level               Home Equipment: Rollator (4 wheels) ("4 wheel drive")          Prior Functioning/Environment Prior Level of Function : Independent/Modified Independent;Driving                        OT Problem List: Impaired balance (sitting and/or standing);Decreased knowledge of use of DME  or AE;Decreased coordination;Decreased cognition;Decreased safety awareness;Decreased strength      OT Treatment/Interventions: Self-care/ADL training;DME and/or AE instruction;Cognitive remediation/compensation;Patient/family education;Balance training;Therapeutic activities    OT Goals(Current goals can be found in the care plan section) Acute Rehab OT Goals OT Goal Formulation: With patient Time For Goal Achievement: 02/05/22 Potential to Achieve Goals: Good ADL Goals Pt Will Perform Grooming: with min guard assist;standing Pt Will Perform Upper Body Bathing: with supervision;sitting Pt Will Perform Lower Body Bathing: with supervision;sit to/from stand Pt Will Perform Upper Body Dressing: with supervision;sitting Pt Will Perform Lower Body Dressing: with supervision;sit to/from stand Pt Will Transfer to Toilet: with supervision;with mod assist;bedside commode Pt Will Perform Toileting - Clothing Manipulation and hygiene: with supervision;sit to/from stand  OT Frequency: Min 2X/week    Co-evaluation PT/OT/SLP Co-Evaluation/Treatment: Yes Reason for Co-Treatment: For patient/therapist safety   OT goals addressed during session: ADL's and self-care      AM-PAC OT "6 Clicks" Daily Activity     Outcome Measure Help from another person eating meals?: A Little Help from another person taking care of personal grooming?: A Little Help from another person toileting, which includes using toliet, bedpan, or urinal?: Total Help from another person bathing (including washing, rinsing, drying)?: A Lot Help from another person to put on and taking off regular upper body clothing?: A Lot Help from another person to put on and taking off regular lower body clothing?: Total 6 Click Score: 12   End of Session Equipment Utilized During Treatment: Rolling walker (2 wheels);Gait belt Nurse Communication: Mobility status  Activity Tolerance: Patient tolerated treatment well Patient left: in  bed;with call bell/phone within reach;with bed alarm set  OT Visit Diagnosis: Unsteadiness on feet (R26.81);Other abnormalities of gait and mobility (R26.89);Muscle weakness (generalized) (M62.81);Cognitive communication deficit (R41.841);History of falling (Z91.81)                Time: 1034-1100 OT Time Calculation (min): 26 min Charges:  OT General Charges $OT Visit: 1 Visit OT Evaluation $OT Eval Moderate Complexity: 1 Mod  Berna Spare, OTR/L Acute Rehabilitation Services Office: [phone]  Evern Bio 01/22/2022, 1:54 PM

## 2022-01-22 NOTE — Plan of Care (Signed)

## 2022-01-22 NOTE — Progress Notes (Signed)
Patient pulled out IV.

## 2022-01-23 DIAGNOSIS — G9341 Metabolic encephalopathy: Secondary | ICD-10-CM | POA: Diagnosis not present

## 2022-01-23 LAB — CBC WITH DIFFERENTIAL/PLATELET
Abs Immature Granulocytes: 0 10*3/uL (ref 0.00–0.07)
Basophils Absolute: 0 10*3/uL (ref 0.0–0.1)
Basophils Relative: 0 %
Eosinophils Absolute: 0 10*3/uL (ref 0.0–0.5)
Eosinophils Relative: 0 %
HCT: 47.8 % (ref 39.0–52.0)
Hemoglobin: 16.2 g/dL (ref 13.0–17.0)
Immature Granulocytes: 0 %
Lymphocytes Relative: 17 %
Lymphs Abs: 0.5 10*3/uL — ABNORMAL LOW (ref 0.7–4.0)
MCH: 32.9 pg (ref 26.0–34.0)
MCHC: 33.9 g/dL (ref 30.0–36.0)
MCV: 97.2 fL (ref 80.0–100.0)
Monocytes Absolute: 0.4 10*3/uL (ref 0.1–1.0)
Monocytes Relative: 11 %
Neutro Abs: 2.3 10*3/uL (ref 1.7–7.7)
Neutrophils Relative %: 72 %
Platelets: 105 10*3/uL — ABNORMAL LOW (ref 150–400)
RBC: 4.92 MIL/uL (ref 4.22–5.81)
RDW: 11.8 % (ref 11.5–15.5)
WBC: 3.2 10*3/uL — ABNORMAL LOW (ref 4.0–10.5)
nRBC: 0 % (ref 0.0–0.2)

## 2022-01-23 LAB — D-DIMER, QUANTITATIVE: D-Dimer, Quant: 0.79 ug/mL-FEU — ABNORMAL HIGH (ref 0.00–0.50)

## 2022-01-23 LAB — COMPREHENSIVE METABOLIC PANEL
ALT: 30 U/L (ref 0–44)
AST: 67 U/L — ABNORMAL HIGH (ref 15–41)
Albumin: 3.2 g/dL — ABNORMAL LOW (ref 3.5–5.0)
Alkaline Phosphatase: 48 U/L (ref 38–126)
Anion gap: 7 (ref 5–15)
BUN: 16 mg/dL (ref 8–23)
CO2: 24 mmol/L (ref 22–32)
Calcium: 8.7 mg/dL — ABNORMAL LOW (ref 8.9–10.3)
Chloride: 105 mmol/L (ref 98–111)
Creatinine, Ser: 1.1 mg/dL (ref 0.61–1.24)
GFR, Estimated: >60 mL/min (ref >60)
Glucose, Bld: 141 mg/dL — ABNORMAL HIGH (ref 70–99)
Potassium: 4.9 mmol/L (ref 3.5–5.1)
Sodium: 136 mmol/L (ref 135–145)
Total Bilirubin: 2 mg/dL — ABNORMAL HIGH (ref 0.3–1.2)
Total Protein: 6.3 g/dL — ABNORMAL LOW (ref 6.5–8.1)

## 2022-01-23 LAB — C-REACTIVE PROTEIN: CRP: 4.5 mg/dL — ABNORMAL HIGH (ref <1.0)

## 2022-01-23 LAB — BRAIN NATRIURETIC PEPTIDE: B Natriuretic Peptide: 54.1 pg/mL (ref 0.0–100.0)

## 2022-01-23 LAB — MAGNESIUM: Magnesium: 2.1 mg/dL (ref 1.7–2.4)

## 2022-01-23 NOTE — Progress Notes (Signed)
Physical Therapy Treatment Patient Details Name: Todd [name] MRN: [id number] DOB: 05-21-1955 Today's Date: 01/23/2022   History of Present Illness 66 y.o. male presents to [facility] hospital on 01/20/2022 s/p 2 falls, experiencing L hip pain, down on the floor for ~24 hours after second fall. + COVID 19 and possible alcohol withdrawal. Pt admitted for management of rhabdomyolysis, SIRS. PMH includes HLD, HTN, Parkinsonism.    PT Comments    Patient more confused today ("they think I'm a Todd [name] spy;" not oriented to place, situation, or time). Now requiring restraints due to earlier getting up by himself (with one fall reported). Able to ambulate with +1 mod assist and RW x 25 ft. Remains very unsteady with narrow base of support and shuffling gait.     Recommendations for follow up therapy are one component of a multi-disciplinary discharge planning process, led by the attending physician.  Recommendations may be updated based on patient status, additional functional criteria and insurance authorization.  Follow Up Recommendations  Home health PT (will need several days to improve prior to discharge home; if cognition does not improve, may need SNF)     Assistance Recommended at Discharge Frequent or constant Supervision/Assistance  Patient can return home with the following Two people to help with walking and/or transfers;Two people to help with bathing/dressing/bathroom;Assistance with cooking/housework;Assistance with feeding;Direct supervision/assist for medications management;Direct supervision/assist for financial management;Assist for transportation;Help with stairs or ramp for entrance   Equipment Recommendations   (TBD pending progress and what he already owns ?has rollator)    Recommendations for Other Services       Precautions / Restrictions Precautions Precautions: Fall Precaution Comments: retropulsive Restrictions Weight Bearing Restrictions: No     Mobility  Bed  Mobility Overal bed mobility: Needs Assistance Bed Mobility: Supine to Sit, Sit to Sidelying     Supine to sit: Mod assist   Sit to sidelying: Mod assist General bed mobility comments: assist to raise torso to come to sit; assist to raise legs back into bed    Transfers Overall transfer level: Needs assistance Equipment used: Rolling walker (2 wheels) Transfers: Sit to/from Stand Sit to Stand: Mod assist           General transfer comment: boosting assist and for balance due to forward lean    Ambulation/Gait Ambulation/Gait assistance: Mod assist Gait Distance (Feet): 25 Feet Assistive device: Rolling walker (2 wheels) Gait Pattern/deviations: Shuffle, Narrow base of support Gait velocity: reduced Gait velocity interpretation: <1.31 ft/sec, indicative of household ambulator   General Gait Details: Dragging RLE; requires assist to move RW and for balance due to anterior imbalance as feet shuffle behind his center of balance   Stairs             Wheelchair Mobility    Modified Rankin (Stroke Patients Only)       Balance Overall balance assessment: Needs assistance Sitting-balance support: Feet supported, Single extremity supported, Bilateral upper extremity supported Sitting balance-Leahy Scale: Poor Sitting balance - Comments: min assist Postural control: Posterior lean (in sitting) Standing balance support: Bilateral upper extremity supported Standing balance-Leahy Scale: Poor Standing balance comment: min assist for balance with RW                            Cognition Arousal/Alertness: Awake/alert Behavior During Therapy: Restless, Anxious Overall Cognitive Status: No family/caregiver present to determine baseline cognitive functioning Area of Impairment: Orientation, Memory, Following commands, Safety/judgement, Awareness  Orientation Level: Place, Time, Situation   Memory: Decreased short-term memory, Decreased  recall of precautions Following Commands: Follows one step commands inconsistently Safety/Judgement: Decreased awareness of deficits, Decreased awareness of safety Awareness: Intellectual   General Comments: pt fell when up alone earlier today and found up in room about to fall a second time; no safety        Exercises      General Comments        Pertinent Vitals/Pain Pain Assessment Pain Assessment: No/denies pain    Home Living                          Prior Function            PT Goals (current goals can now be found in the care plan section) Acute Rehab PT Goals Patient Stated Goal: to go home Time For Goal Achievement: 02/04/22 Potential to Achieve Goals: Fair Progress towards PT goals: Progressing toward goals    Frequency    Min 4X/week      PT Plan Discharge plan needs to be updated    Co-evaluation              AM-PAC PT "6 Clicks" Mobility   Outcome Measure  Help needed turning from your back to your side while in a flat bed without using bedrails?: A Lot Help needed moving from lying on your back to sitting on the side of a flat bed without using bedrails?: A Lot Help needed moving to and from a bed to a chair (including a wheelchair)?: A Lot Help needed standing up from a chair using your arms (e.g., wheelchair or bedside chair)?: A Lot Help needed to walk in hospital room?: A Lot Help needed climbing 3-5 steps with a railing? : Total 6 Click Score: 11    End of Session Equipment Utilized During Treatment: Gait belt Activity Tolerance: Treatment limited secondary to medical complications (Comment) (decr cognition) Patient left: in bed;with call bell/phone within reach;with bed alarm set;with restraints reapplied (bil wrist restraints)   PT Visit Diagnosis: Other abnormalities of gait and mobility (R26.89);Muscle weakness (generalized) (M62.81);Other symptoms and signs involving the nervous system (R29.898)     Time:  8315-1761 PT Time Calculation (min) (ACUTE ONLY): 21 min  Charges:  $Gait Training: 8-22 mins                      Jerolyn Center, PT Acute Rehabilitation Services  Office [phone]    Zena Amos 01/23/2022, 1:36 PM

## 2022-01-23 NOTE — Plan of Care (Signed)
  Problem: Safety: Goal: Non-violent Restraint(s) Outcome: Progressing   

## 2022-01-23 NOTE — Progress Notes (Signed)
Patient was found down on bathroom floor by Dr. Thedore Mins. No distress or pain noted. No new order. There is 1:1 sitter order put in from night shift. No staff has been provided yet. Patient on CIWA. Ativan given per protocol.

## 2022-01-23 NOTE — Progress Notes (Signed)
Mobility Specialist Progress Note:   01/23/22 1105  Mobility  Activity Ambulated with assistance in room  Level of Assistance Moderate assist, patient does 50-74%  Assistive Device Other (Comment) (HHA)  Distance Ambulated (ft) 12 ft  Activity Response Tolerated poorly  $Mobility charge 1 Mobility   Responded to bed alarm, pt found ambulating in room with significantly unsteady gait and no AD. Pt with heavy posterior lean requiring up to modA to correct. Pt A&O x0 at this time, back in bed with bed alarm on. All needs met, RN notified.  Nelta Numbers Acute Rehab Secure Chat or Office Phone: [phone]

## 2022-01-23 NOTE — Progress Notes (Signed)
Patient constantly trying to get out of bed even with ativan and reorientation. No signs of understanding safety education. Very high risk for fall with unsteady gait. Received report from night nurse that 1:1 sitter was in and it turned out of the tele monitor order which would be ineffective with the patient. Has been asking for 1:1 sitter, but unable to staff at the moment. Asked for bilateral non violent soft restraint for the patient.

## 2022-01-23 NOTE — Progress Notes (Signed)
PROGRESS NOTE                                                                                                                                                                                                             Patient Demographics:    Todd [name], is a 66 y.o. male, DOB - 1955-12-28, VZC:[id number]  Outpatient Primary MD for the patient is Rinaldo Cloud, MD    LOS - 2  Admit date - 01/20/2022    Chief Complaint  Patient presents with   Fall       Brief Narrative (HPI from H&P)  66 year old male with past medical history of hyperlipidemia, hypothyroidism, hypertension and parkinsonism (follows with Dr. Terrace Arabia with Neurology)  who presents to [facility] emergency department via EMS status post fall.  Patient who lives with his sister claims that he mowed his lawn on Friday morning and became dehydrated and weak, subsequently he went inside his house and sustained a fall, according to the sister he was on the floor for 24 hours in fact she made up a bed for him on the floor so that he could sleep, about 12 hours into the fall he started getting little confused, he was subsequently brought to the ER as he was getting confused and was unable to get off the floor.  In the ER CT head and MRI brain were unremarkable, soon after he was seen by the admitting physician around 2 AM he went into what looks like alcohol withdrawal.  He was started on CIWA protocol.  Also his work-up subsequently came back positive for COVID-19 infection, he is currently admitted for dehydration, generalized weakness, metabolic encephalopathy, possible early DTs and COVID-19 infection.   Subjective:   Patient is confused but denies any headache chest or abdominal pain, no shortness of breath.   Assessment  & Plan :    Dehydration, mild COVID-19 infection, fall at home with acute metabolic encephalopathy - some cute decline likely due to  acute COVID-19 infection as well, does have mild inflammation as suggested by borderline D-dimer and CRP, chest x-ray stable and he is not hypoxic, continue to hydrate with IV fluids, trial of IV Decadron, PT OT.  Still extremely weak although eager to go home however reportedly he lives with his sister and she is sick as well.  Thorough work-up  including CT, CTA head and neck, MRI brain and EEG unremarkable, stable TSH, B12 and RPR.  Continue supportive care we will also get neurology input as well.   SIRS (systemic inflammatory response syndrome) (HCC) - likely due to COVID-19 infection, stable after IV fluids continue to monitor.  Kindly see above.   Rhabdomyolysis- Patient presenting after lying down in his bedroom for over 24 hours with an markedly elevated creatinine kinase, due to fall and staying on the floor for 24 hours.  Hydrate with IV fluids.  Elevated troponin level not due myocardial infarction - Slightly elevated serial troponins with flat trajectory of elevation, Patient is chest pain-free, EKG nonacute continue to monitor clinically.  Home dose aspirin, beta-blocker and statin resumed for secondary prevention.  Parkinsonism (HCC) - Follows with Dr. Terrace Arabia in the outpatient setting, Trial of Sinemet was recently discontinued earlier in the year due to being ineffective.  Monitor with supportive care.  Essential hypertension - placed on Coreg along with low-dose Catapres for DTs and as needed hydralazine.   Mixed hyperlipidemia - on 10 mg of Lipitor daily and fenofibrate 50 mg daily  Prolonged QT interval - keep potassium around 4 and magnesium around 2, Coreg and monitor.  Avoid QT prolonging medications.  CKD 3B.  Baseline creatinine appears to be close to 1.4.  Monitor.  History of alcohol use.  Denies any excessive use states he drinks 1 bottle of beer every other day, is little inconsistent but since he is more awake alert today I doubt he is in DTs and possibly truthful about  his alcohol intake.  Continue to monitor.  Monitor on CIWA protocol administer Ativan only if he has agitation as he has resting tremors from Parkinson's.       Condition - Extremely Guarded  Family Communication  :  sister [name] [phone] on 01/21/22, no response on the phone on 01/22/2022  Code Status :  Full  Consults  :  None  PUD Prophylaxis : PPI   Procedures  :     CT head, CTA head and neck & MRI brain.  All nonacute.      Disposition Plan  :    Status is: Inpatient  DVT Prophylaxis  :    enoxaparin (LOVENOX) injection 40 mg Start: 01/21/22 0245   Lab Results  Component Value Date   PLT 105 (L) 01/23/2022    Diet :  Diet Order             DIET SOFT Room service appropriate? Yes; Fluid consistency: Thin  Diet effective now                    Inpatient Medications  Scheduled Meds:  aspirin EC  81 mg Oral Daily   atorvastatin  10 mg Oral Daily   carvedilol  3.125 mg Oral Daily   cloNIDine  0.1 mg Oral TID   dexamethasone (DECADRON) injection  6 mg Intravenous Q24H   enoxaparin (LOVENOX) injection  40 mg Subcutaneous Q24H   fenofibrate  54 mg Oral Daily   folic acid  1 mg Oral Daily   LORazepam  0-4 mg Intravenous Q12H   multivitamin with minerals  1 tablet Oral Daily   omega-3 acid ethyl esters  1 g Oral Daily   pantoprazole  40 mg Oral Daily   thiamine  100 mg Oral Daily   Or   thiamine  100 mg Intravenous Daily   Continuous Infusions:   PRN Meds:.acetaminophen **OR** acetaminophen, albuterol,  hydrALAZINE, LORazepam **OR** LORazepam, menthol-cetylpyridinium, polyethylene glycol  Antibiotics  :    Anti-infectives (From admission, onward)    None        Time Spent in minutes  30   Susa Raring M.D on 01/23/2022 at 8:43 AM  To page go to [email or web address]   [facility] -  Office  [phone]  See all Orders from today for further details    Objective:   Vitals:   01/22/22 1721 01/22/22 2046 01/23/22 0534  01/23/22 0824  BP: 107/81 113/89 (!) 118/93   Pulse: 65 65 (!) 59 60  Resp: 17 16 18    Temp: 97.9 F (36.6 C) (!) 97.4 F (36.3 C) 98.3 F (36.8 C)   TempSrc: Oral Oral Oral   SpO2: 93% 96% 96%   Weight:      Height:        Wt Readings from Last 3 Encounters:  01/21/22 80.9 kg  07/13/21 86.2 kg  04/14/21 83 kg     Intake/Output Summary (Last 24 hours) at 01/23/2022 0843 Last data filed at 01/23/2022 0600 Gross per 24 hour  Intake 1664.58 ml  Output 1850 ml  Net -185.42 ml     Physical Exam  Awake confused, was standing in the bathroom and had walked there by himself was about to fall, was brought back to the bed with person assistance, no focal deficits, mild baseline tremors due to his Parkinson's [place].AT,PERRAL Supple Neck, No JVD,   Symmetrical Chest wall movement, Good air movement bilaterally, CTAB RRR,No Gallops, Rubs or new Murmurs,  +ve B.Sounds, Abd Soft, No tenderness,   No Cyanosis, Clubbing or edema     Data Review:    CBC Recent Labs  Lab 01/20/22 1522 01/20/22 2102 01/21/22 0450 01/22/22 0631 01/23/22 0646  WBC 5.8  --  5.5 5.5 3.2*  HGB 16.7 16.0 16.3 15.8 16.2  HCT 48.5 47.0 47.7 45.5 47.8  PLT 120*  --  110* 100* 105*  MCV 98.0  --  98.1 97.4 97.2  MCH 33.7  --  33.5 33.8 32.9  MCHC 34.4  --  34.2 34.7 33.9  RDW 11.9  --  11.9 11.9 11.8  LYMPHSABS 0.7  --  0.6* 0.9 0.5*  MONOABS 1.1*  --  0.8 0.6 0.4  EOSABS 0.0  --  0.0 0.0 0.0  BASOSABS 0.0  --  0.0 0.0 0.0    Electrolytes Recent Labs  Lab 01/20/22 1522 01/20/22 2034 01/20/22 2102 01/21/22 0450 01/22/22 0631 01/23/22 0646  NA 135  --  136 135 134* 136  K 4.3  --  3.9 3.5 4.3 4.9  CL 99  --   --  103 102 105  CO2 23  --   --  20* 21* 24  GLUCOSE 101*  --   --  90 97 141*  BUN 16  --   --  14 12 16   CREATININE 1.41*  --   --  1.40* 1.02 1.10  CALCIUM 8.8*  --   --  7.9* 8.4* 8.7*  AST 78*  --   --  74* 72* 67*  ALT 31  --   --  29 28 30   ALKPHOS 51  --   --  52 43 48   BILITOT 2.1*  --   --  2.0* 1.9* 2.0*  ALBUMIN 4.0  --   --  3.5 3.0* 3.2*  MG  --   --   --  1.7 2.1 2.1  CRP  --   --   --  8.2* 7.8* 4.5*  DDIMER  --   --   --   --  1.14* 0.79*  PROCALCITON  --   --   --  0.18  --   --   LATICACIDVEN  --   --   --  1.1  --   --   TSH  --  0.857  --   --   --   --   AMMONIA  --  29  --   --   --   --   BNP  --   --   --   --  44.3 54.1    Recent Labs    01/20/22 2034  TSH 0.857     Radiology Reports EEG adult  Result Date: 01/22/2022 Charlsie QuestYadav, Priyanka O, MD     01/22/2022  3:40 PM Patient Name: Marliss CzarCarmelo Lupu MRN: [id number] Epilepsy Attending: Charlsie QuestPriyanka O Yadav Referring Physician/Provider: Marinda ElkShalhoub, George J, MD Date: 01/22/2022 Duration: 27.39 mins Patient history: 66yo M with ams. EEG to evaluate for seizure Level of alertness: Awake AEDs during EEG study: None Technical aspects: This EEG study was done with scalp electrodes positioned according to the 10-20 International system of electrode placement. Electrical activity was reviewed with band pass filter of 1-70Hz , sensitivity of 7 uV/mm, display speed of 7430mm/sec with a 60Hz  notched filter applied as appropriate. EEG data were recorded continuously and digitally stored.  Video monitoring was available and reviewed as appropriate. Description: The posterior dominant rhythm consists of 8 Hz activity of moderate voltage (25-35 uV) seen predominantly in posterior head regions, symmetric and reactive to eye opening and eye closing. Hyperventilation and photic stimulation were not performed.   IMPRESSION: This study is within normal limits. No seizures or epileptiform discharges were seen throughout the recording. A normal interictal EEG does not exclude nor support the diagnosis of epilepsy. Charlsie Questriyanka O Yadav   MR BRAIN WO CONTRAST  Result Date: 01/20/2022 CLINICAL DATA:  Larey SeatFell out of bed, delirium EXAM: MRI HEAD WITHOUT CONTRAST TECHNIQUE: Multiplanar, multiecho pulse sequences of the brain and  surrounding structures were obtained without intravenous contrast. COMPARISON:  No prior MRI, correlation is made with CT head and CTA head neck 01/20/2022 FINDINGS: Brain: No restricted diffusion to suggest acute or subacute infarct. No acute hemorrhage, mass, mass effect, or midline shift. No hemosiderin deposition to suggest remote hemorrhage. No hydrocephalus or extra-axial collection. Scattered T2 hyperintense signal in the periventricular white matter, likely the sequela of mild-to-moderate chronic small vessel ischemic disease. Vascular: Normal arterial flow voids. Skull and upper cervical spine: Normal marrow signal. Sinuses/Orbits: Mucous retention cyst in the left maxillary sinus. Mild mucosal thickening in the maxillary sinuses. The orbits are unremarkable. Other: Trace fluid in the right mastoid air cells. IMPRESSION: No acute intracranial process. No evidence of acute or subacute infarct. Electronically Signed   By: Wiliam KeAlison  Vasan M.D.   On: 01/20/2022 22:12   CT ANGIO HEAD NECK W WO CM  Result Date: 01/20/2022 CLINICAL DATA:  Fall EXAM: CT ANGIOGRAPHY HEAD AND NECK TECHNIQUE: Multidetector CT imaging of the head and neck was performed using the standard protocol during bolus administration of intravenous contrast. Multiplanar CT image reconstructions and MIPs were obtained to evaluate the vascular anatomy. Carotid stenosis measurements (when applicable) are obtained utilizing NASCET criteria, using the distal internal carotid diameter as the denominator. RADIATION DOSE REDUCTION: This exam was performed according to the departmental dose-optimization program which includes automated exposure control, adjustment of the mA and/or kV according to patient size  and/or use of iterative reconstruction technique. CONTRAST:  OMNIPAQUE IOHEXOL 350 MG/ML SOLN COMPARISON:  No prior CTA, correlation is made with 01/20/2022 CT head FINDINGS: CT HEAD FINDINGS CT HEAD FINDINGS For noncontrast findings, please  see same day CT head. 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 Imaged portion shows no evidence of aneurysm or dissection. No significant stenosis of the arch vessel origins. Right carotid system: No evidence of dissection, occlusion, or hemodynamically significant stenosis (greater than 50%). Left carotid system: No evidence of dissection, occlusion, or hemodynamically significant stenosis (greater than 50%). Retropharyngeal course of the proximal left ICA. Vertebral arteries: No evidence of dissection, occlusion, or hemodynamically significant stenosis (greater than 50%). Skeleton: No acute osseous abnormality. Degenerative changes in the cervical spine Other neck: Negative. Upper chest: No focal pulmonary opacity or pleural effusion. Review of the MIP images confirms the above findings CTA HEAD FINDINGS Anterior circulation: Both internal carotid arteries are patent to the termini, without significant stenosis. A1 segments patent. Normal anterior communicating artery. Anterior cerebral arteries are patent to their distal aspects. No M1 stenosis or occlusion. MCA branches perfused and symmetric. Posterior circulation: Vertebral arteries patent to the vertebrobasilar junction without stenosis. Basilar patent to its distal aspect. Superior cerebellar arteries patent proximally. Patent P1 segments. PCAs perfused to their distal aspects without stenosis. The left posterior communicating artery is patent. Venous sinuses: As permitted by contrast timing, patent. Anatomic variants: None significant. Review of the MIP images confirms the above findings IMPRESSION: 1.  No intracranial large vessel occlusion or significant stenosis. 2.  No hemodynamically significant stenosis in the neck. Electronically Signed   By: Wiliam Ke M.D.   On: 01/20/2022 21:44   CT Head Wo Contrast  Result Date: 01/20/2022 CLINICAL DATA:  Fall on  Friday and unable to get up. EXAM: CT HEAD WITHOUT CONTRAST TECHNIQUE: Contiguous axial images were obtained from the base of the skull through the vertex without intravenous contrast. RADIATION DOSE REDUCTION: This exam was performed according to the departmental dose-optimization program which includes automated exposure control, adjustment of the mA and/or kV according to patient size and/or use of iterative reconstruction technique. COMPARISON:  None Available. FINDINGS: Brain: Ventricles within normal limits. Mild chronic small vessel ischemic change within the deep periventricular white matter regions bilaterally. No mass, hemorrhage, edema or other evidence of acute parenchymal abnormality. No extra-axial hemorrhage. Vascular: Chronic calcified atherosclerotic changes of the large vessels at the skull base. No unexpected hyperdense vessel. Skull: Normal. Negative for fracture or focal lesion. Sinuses/Orbits: No acute finding. Other: None. IMPRESSION: 1. No acute findings. No intracranial mass, hemorrhage or edema. No skull fracture. 2. Mild chronic small vessel ischemic change in the deep periventricular white matter regions. Electronically Signed   By: Bary Richard M.D.   On: 01/20/2022 17:30   DG Hip Unilat W or Wo Pelvis 2-3 Views Left  Result Date: 01/20/2022 CLINICAL DATA:  Fall, left hip pain EXAM: DG HIP (WITH OR WITHOUT PELVIS) 2-3V LEFT COMPARISON:  None Available. FINDINGS: There is no evidence of hip fracture or dislocation. There is no evidence of significant arthropathy or other focal bone abnormality. IMPRESSION: Negative. Electronically Signed   By: Duanne Guess D.O.   On: 01/20/2022 16:28   DG Chest 1 View  Result Date: 01/20/2022 CLINICAL DATA:  Fall EXAM: CHEST  1 VIEW COMPARISON:  None Available. FINDINGS: The heart size and mediastinal contours are within normal limits.  Both lungs are clear. The visualized skeletal structures are unremarkable. IMPRESSION: No active disease.  Electronically Signed   By: Darliss Cheney M.D.   On: 01/20/2022 16:25

## 2022-01-23 NOTE — TOC Initial Note (Addendum)
Transition of Care [facility]) - Initial/Assessment Note    Patient Details  Name: Todd [name] MRN: [id number] Date of Birth: February 04, 1956  Transition of Care [facility]) CM/SW Contact:    Tom-Johnson, Hershal Coria, RN Phone Number: 01/23/2022, 3:06 PM  Clinical Narrative:                  Patient is admitted for Acute Metabolic Encephalopathy after a fall at home. From home with sister. Patient has a sitter at bedside for increased confusion. CM unable to assess patient at this time due to confusion. CM tried to reach out to patient's sister, [name] but she has a voice mail that has not been set up yet.  CM will continue to reach out and follow as patient progresses with care towards discharge.   16:50-CM received a call from [place], patient's sister. CM notified her of home health recommendation and she states there are preference for home health. Referral called in to [facility] and Tresa Endo voiced acceptance. Info on AVS. CM will continue to follow with needs.   Expected Discharge Plan: Home w Home Health Services Barriers to Discharge: Continued Medical Work up   Patient Goals and CMS Choice Patient states their goals for this hospitalization and ongoing recovery are:: To return home CMS Medicare.gov Compare Post Acute Care list provided to:: Patient Choice offered to / list presented to : Patient  Expected Discharge Plan and Services Expected Discharge Plan: Home w Home Health Services   Discharge Planning Services: CM Consult Post Acute Care Choice: Home Health Living arrangements for the past 2 months: Single Family Home                 DME Arranged: N/A DME Agency: NA       HH Arranged: PT, OT HH Agency: [facility] Date HH Agency Contacted: 01/23/22 Time HH Agency Contacted: 1325 Representative spoke with at [facility] Agency: Tresa Endo  Prior Living Arrangements/Services Living arrangements for the past 2 months: Single Family Home   Patient language and need for  interpreter reviewed:: Yes Do you feel safe going back to the place where you live?: Yes      Need for Family Participation in Patient Care: Yes (Comment) Care giver support system in place?: Yes (comment)   Criminal Activity/Legal Involvement Pertinent to Current Situation/Hospitalization: No - Comment as needed  Activities of Daily Living      Permission Sought/Granted Permission sought to share information with : Case Manager, Magazine features editor, Family Supports Permission granted to share information with : Yes, Verbal Permission Granted              Emotional Assessment Appearance:: Appears stated age Attitude/Demeanor/Rapport: Engaged, Gracious Affect (typically observed): Accepting, Appropriate, Calm, Hopeful, Pleasant Orientation: : Oriented to Self Alcohol / Substance Use: Illicit Drugs (THC) Psych Involvement: No (comment)  Admission diagnosis:  Acute delirium [R41.0] Weakness [R53.1] Fall, initial encounter [W19.XXXA] Unable to ambulate [R26.2] Traumatic rhabdomyolysis, initial encounter (HCC) [T79.6XXA] Patient Active Problem List   Diagnosis Date Noted   Elevated troponin level not due myocardial infarction 01/21/2022   Rhabdomyolysis 01/21/2022   Acute metabolic encephalopathy 01/21/2022   Fall at home, initial encounter 01/21/2022   SIRS (systemic inflammatory response syndrome) (HCC) 01/21/2022   Mixed hyperlipidemia 01/21/2022   Essential hypertension 01/21/2022   Prolonged QT interval 01/21/2022   Weakness 01/21/2022   Generalized weakness 01/20/2022   Parkinsonism (HCC) 04/14/2021   PCP:  Rinaldo Cloud, MD Pharmacy:   [facility] [address] [place] -  [address] [place] [address] Phone: [phone] Fax: [phone]     Social Determinants of Health (SDOH) Interventions    Readmission Risk Interventions     No data to display

## 2022-01-24 DIAGNOSIS — G9341 Metabolic encephalopathy: Secondary | ICD-10-CM | POA: Diagnosis not present

## 2022-01-24 LAB — CBC WITH DIFFERENTIAL/PLATELET
Abs Immature Granulocytes: 0.03 10*3/uL (ref 0.00–0.07)
Basophils Absolute: 0 10*3/uL (ref 0.0–0.1)
Basophils Relative: 0 %
Eosinophils Absolute: 0 10*3/uL (ref 0.0–0.5)
Eosinophils Relative: 0 %
HCT: 45 % (ref 39.0–52.0)
Hemoglobin: 15.8 g/dL (ref 13.0–17.0)
Immature Granulocytes: 1 %
Lymphocytes Relative: 9 %
Lymphs Abs: 0.6 10*3/uL — ABNORMAL LOW (ref 0.7–4.0)
MCH: 33.3 pg (ref 26.0–34.0)
MCHC: 35.1 g/dL (ref 30.0–36.0)
MCV: 94.9 fL (ref 80.0–100.0)
Monocytes Absolute: 0.4 10*3/uL (ref 0.1–1.0)
Monocytes Relative: 7 %
Neutro Abs: 5 10*3/uL (ref 1.7–7.7)
Neutrophils Relative %: 83 %
Platelets: 94 10*3/uL — ABNORMAL LOW (ref 150–400)
RBC: 4.74 MIL/uL (ref 4.22–5.81)
RDW: 11.6 % (ref 11.5–15.5)
WBC: 6 10*3/uL (ref 4.0–10.5)
nRBC: 0 % (ref 0.0–0.2)

## 2022-01-24 LAB — COMPREHENSIVE METABOLIC PANEL
ALT: 31 U/L (ref 0–44)
AST: 58 U/L — ABNORMAL HIGH (ref 15–41)
Albumin: 3.4 g/dL — ABNORMAL LOW (ref 3.5–5.0)
Alkaline Phosphatase: 46 U/L (ref 38–126)
Anion gap: 12 (ref 5–15)
BUN: 20 mg/dL (ref 8–23)
CO2: 22 mmol/L (ref 22–32)
Calcium: 8.7 mg/dL — ABNORMAL LOW (ref 8.9–10.3)
Chloride: 104 mmol/L (ref 98–111)
Creatinine, Ser: 1.06 mg/dL (ref 0.61–1.24)
GFR, Estimated: >60 mL/min (ref >60)
Glucose, Bld: 129 mg/dL — ABNORMAL HIGH (ref 70–99)
Potassium: 4.6 mmol/L (ref 3.5–5.1)
Sodium: 138 mmol/L (ref 135–145)
Total Bilirubin: 2.4 mg/dL — ABNORMAL HIGH (ref 0.3–1.2)
Total Protein: 6.2 g/dL — ABNORMAL LOW (ref 6.5–8.1)

## 2022-01-24 LAB — BRAIN NATRIURETIC PEPTIDE: B Natriuretic Peptide: 73 pg/mL (ref 0.0–100.0)

## 2022-01-24 LAB — D-DIMER, QUANTITATIVE: D-Dimer, Quant: 0.75 ug/mL-FEU — ABNORMAL HIGH (ref 0.00–0.50)

## 2022-01-24 LAB — GLUCOSE, CAPILLARY: Glucose-Capillary: 104 mg/dL — ABNORMAL HIGH (ref 70–99)

## 2022-01-24 LAB — C-REACTIVE PROTEIN: CRP: 1.7 mg/dL — ABNORMAL HIGH (ref <1.0)

## 2022-01-24 LAB — MAGNESIUM: Magnesium: 2.1 mg/dL (ref 1.7–2.4)

## 2022-01-24 MED ORDER — HALOPERIDOL LACTATE 5 MG/ML IJ SOLN
3.0000 mg | Freq: Three times a day (TID) | INTRAMUSCULAR | Status: DC | PRN
  Administered 2022-01-24 – 2022-01-30 (×5): 3 mg via INTRAMUSCULAR
  Filled 2022-01-24 (×5): qty 1

## 2022-01-24 NOTE — Progress Notes (Signed)
Patient removed from cardiac telemetry after confirming with MD Thedore Mins that order placed on 01/22/2022 to discontinue cardiac monitoring was appropriate.

## 2022-01-24 NOTE — Progress Notes (Signed)
Occupational Therapy Treatment Patient Details Name: Todd [name] MRN: [id number] DOB: [birth year] Today's Date: 01/24/2022   History of present illness 66 y.o. male presents to [facility] hospital on 01/20/2022 s/p 2 falls, experiencing L hip pain, down on the floor for ~24 hours after second fall. + COVID 19 and possible alcohol withdrawal,on CIWA precautions.  Pt admitted for management of rhabdomyolysis, SIRS. PMH includes HLD, HTN, Parkinsonism.   OT comments  Pt continues to be disoriented with poor safety awareness and reports visual hallucination of water coming out of the wall. Pt requiring more assistance for bed mobility this visit and +2 mod assist for ambulation with RW in room. Min to mod assist needed for standing balance during ADLs at sink and mod assist to brush teeth to set up toothbrush and stabilize L UE due to increased tremor. Pt's progress is slower than anticipated. Updated recommendation to SNF for further rehab prior to return home.    Recommendations for follow up therapy are one component of a multi-disciplinary discharge planning process, led by the attending physician.  Recommendations may be updated based on patient status, additional functional criteria and insurance authorization.    Follow Up Recommendations  Skilled nursing-short term rehab (<3 hours/day)    Assistance Recommended at Discharge Frequent or constant Supervision/Assistance  Patient can return home with the following  Two people to help with walking and/or transfers;A lot of help with bathing/dressing/bathroom;Assistance with cooking/housework;Direct supervision/assist for medications management;Direct supervision/assist for financial management;Assist for transportation;Help with stairs or ramp for entrance   Equipment Recommendations  Other (comment) (defer to next venue)    Recommendations for Other Services      Precautions / Restrictions Precautions Precautions: Fall Precaution Comments:  retropulsive Restrictions Weight Bearing Restrictions: No       Mobility Bed Mobility Overal bed mobility: Needs Assistance Bed Mobility: Supine to Sit, Sit to Supine, Rolling Rolling: Total assist   Supine to sit: Max assist Sit to supine: Mod assist   General bed mobility comments: min assist for LEs to EOB, max assist to raise trunk, assist for LEs back into bed, pt fatigued at end of session, rolled with total assist to change wet linens    Transfers Overall transfer level: Needs assistance Equipment used: Rolling walker (2 wheels) Transfers: Sit to/from Stand Sit to Stand: Min assist, +2 safety/equipment           General transfer comment: min assist for transitioning weight over feet, posterior bias     Balance Overall balance assessment: Needs assistance   Sitting balance-Leahy Scale: Poor Sitting balance - Comments: posterior LOB with attempt to don socks, fair balance statically Postural control: Posterior lean Standing balance support: Bilateral upper extremity supported, Single extremity supported Standing balance-Leahy Scale: Poor Standing balance comment: min assist with one hand stablizing on sink, mod assist with no UE stabilization with pt leaning posterior and L                           ADL either performed or assessed with clinical judgement   ADL Overall ADL's : Needs assistance/impaired Eating/Feeding: Set up;Bed level Eating/Feeding Details (indicate cue type and reason): drinking with straw, pt declined eating Grooming: Oral care;Standing;Moderate assistance Grooming Details (indicate cue type and reason): assist to set up toothbrush and to stablize L arm due to tremor, min assist for balance with one hand stabilizing on sink             Lower Body  Dressing: Total assistance;Sitting/lateral leans Lower Body Dressing Details (indicate cue type and reason): socks, loses balance posterior with attempt in sitting              Functional mobility during ADLs: Moderate assistance;+2 for physical assistance;Rolling walker (2 wheels)      Extremity/Trunk Assessment              Vision       Perception     Praxis      Cognition Arousal/Alertness: Awake/alert Behavior During Therapy: Flat affect Overall Cognitive Status: No family/caregiver present to determine baseline cognitive functioning Area of Impairment: Orientation, Memory, Following commands, Safety/judgement, Awareness                 Orientation Level: Disoriented to, Time, Situation   Memory: Decreased short-term memory Following Commands: Follows one step commands inconsistently, Follows one step commands with increased time Safety/Judgement: Decreased awareness of safety, Decreased awareness of deficits Awareness: Intellectual   General Comments: Pt with visual hallucination of water coming out of the walls.        Exercises      Shoulder Instructions       General Comments      Pertinent Vitals/ Pain       Pain Assessment Pain Assessment: Faces Faces Pain Scale: No hurt  Home Living                                          Prior Functioning/Environment              Frequency  Min 2X/week        Progress Toward Goals  OT Goals(current goals can now be found in the care plan section)  Progress towards OT goals: Progressing toward goals  Acute Rehab OT Goals OT Goal Formulation: With patient Time For Goal Achievement: 02/05/22 Potential to Achieve Goals: Good  Plan Discharge plan needs to be updated    Co-evaluation    PT/OT/SLP Co-Evaluation/Treatment: Yes Reason for Co-Treatment: For patient/therapist safety;Necessary to address cognition/behavior during functional activity   OT goals addressed during session: ADL's and self-care      AM-PAC OT "6 Clicks" Daily Activity     Outcome Measure   Help from another person eating meals?: A Little Help from another person  taking care of personal grooming?: A Lot Help from another person toileting, which includes using toliet, bedpan, or urinal?: Total Help from another person bathing (including washing, rinsing, drying)?: A Lot Help from another person to put on and taking off regular upper body clothing?: A Lot Help from another person to put on and taking off regular lower body clothing?: Total 6 Click Score: 11    End of Session Equipment Utilized During Treatment: Rolling walker (2 wheels);Gait belt  OT Visit Diagnosis: Unsteadiness on feet (R26.81);Other abnormalities of gait and mobility (R26.89);Muscle weakness (generalized) (M62.81);Cognitive communication deficit (R41.841);History of falling (Z91.81)   Activity Tolerance Patient tolerated treatment well   Patient Left in bed;with call bell/phone within reach;with bed alarm set   Nurse Communication Mobility status;Other (comment) (cognition)        Time: 4166-0630 OT Time Calculation (min): 37 min  Charges: OT General Charges $OT Visit: 1 Visit OT Treatments $Self Care/Home Management : 8-22 mins  Berna Spare, OTR/L Acute Rehabilitation Services Office: [phone]   Evern Bio 01/24/2022, 9:56 AM

## 2022-01-24 NOTE — Progress Notes (Signed)
Physical Therapy Treatment Patient Details Name: Todd [name] MRN: [id number] DOB: [birth year] Today's Date: 01/24/2022   History of Present Illness 66 y.o. male presents to [facility] hospital on 01/20/2022 s/p 2 falls, experiencing L hip pain, down on the floor for ~24 hours after second fall. + COVID 19 and possible alcohol withdrawal,on CIWA precautions. +confusion  Pt admitted for management of rhabdomyolysis, SIRS. PMH includes HLD, HTN, Parkinsonism.    PT Comments    Patient remains confused which limits his ability to mobilize safely and to even make consistent corrections as cues given. At times could incr his step length and balance improved, but quickly reverts to shuffling steps with very short step length and narrow base of support. Pt pushes RW too far ahead (needs assist to maneuver RW safely) and weight-shifts too far anteriorly beyond his base of support. Due to lack of progress as initially anticipated, discharge recommendation updated to SNF for continued therapies. If pt's cognition improves and functionally improves, will update discharge plan if appropriate.     Recommendations for follow up therapy are one component of a multi-disciplinary discharge planning process, led by the attending physician.  Recommendations may be updated based on patient status, additional functional criteria and insurance authorization.  Follow Up Recommendations  Skilled nursing-short term rehab (<3 hours/day) Can patient physically be transported by private vehicle: No   Assistance Recommended at Discharge Frequent or constant Supervision/Assistance  Patient can return home with the following Two people to help with walking and/or transfers;Two people to help with bathing/dressing/bathroom;Assistance with cooking/housework;Assistance with feeding;Direct supervision/assist for medications management;Direct supervision/assist for financial management;Assist for transportation;Help with stairs or ramp for  entrance   Equipment Recommendations  Other (comment) (TBD pending progress and what he already owns ?has rollator)    Recommendations for Other Services       Precautions / Restrictions Precautions Precautions: Fall Precaution Comments: retropulsive Restrictions Weight Bearing Restrictions: No     Mobility  Bed Mobility Overal bed mobility: Needs Assistance Bed Mobility: Supine to Sit, Sit to Supine, Rolling Rolling: Total assist   Supine to sit: Max assist Sit to supine: Mod assist   General bed mobility comments: min assist for LEs to EOB, max assist to raise trunk, assist for LEs back into bed, pt fatigued at end of session, rolled with total assist to change wet linens    Transfers Overall transfer level: Needs assistance Equipment used: Rolling walker (2 wheels) Transfers: Sit to/from Stand Sit to Stand: Min assist, +2 safety/equipment           General transfer comment: min assist for transitioning weight over feet, posterior bias    Ambulation/Gait Ambulation/Gait assistance: Mod assist, +2 physical assistance Gait Distance (Feet): 50 Feet Assistive device: Rolling walker (2 wheels) Gait Pattern/deviations: Shuffle, Narrow base of support, Decreased dorsiflexion - right, Decreased dorsiflexion - left Gait velocity: reduced Gait velocity interpretation: <1.31 ft/sec, indicative of household ambulator   General Gait Details: Dragging RLE; requires assist to move RW and for balance due to anterior imbalance as feet shuffle behind his center of balance; initially able to incr step length on command, but then reverting to shuffling steps and could not duplicate   Stairs             Wheelchair Mobility    Modified Rankin (Stroke Patients Only)       Balance Overall balance assessment: Needs assistance Sitting-balance support: Feet supported, Single extremity supported, Bilateral upper extremity supported Sitting balance-Leahy Scale:  Poor Sitting balance -  Comments: posterior LOB with attempt to don socks, fair balance statically Postural control: Posterior lean, Left lateral lean Standing balance support: Bilateral upper extremity supported, Single extremity supported Standing balance-Leahy Scale: Poor Standing balance comment: min assist with one hand stablizing on sink, mod assist with no UE stabilization with pt leaning posterior and L                            Cognition Arousal/Alertness: Awake/alert Behavior During Therapy: Flat affect Overall Cognitive Status: No family/caregiver present to determine baseline cognitive functioning Area of Impairment: Orientation, Memory, Following commands, Safety/judgement, Awareness                 Orientation Level: Disoriented to, Time, Situation   Memory: Decreased short-term memory Following Commands: Follows one step commands inconsistently, Follows one step commands with increased time Safety/Judgement: Decreased awareness of safety, Decreased awareness of deficits Awareness: Intellectual   General Comments: Pt with visual hallucination of water coming out of the walls.        Exercises      General Comments        Pertinent Vitals/Pain Pain Assessment Pain Assessment: Faces Faces Pain Scale: No hurt    Home Living                          Prior Function            PT Goals (current goals can now be found in the care plan section) Acute Rehab PT Goals Patient Stated Goal: to go home Time For Goal Achievement: 02/04/22 Potential to Achieve Goals: Fair Progress towards PT goals: Not progressing toward goals - comment (decr cognition continues to limit participation)    Frequency    Min 3X/week      PT Plan Discharge plan needs to be updated    Co-evaluation   Reason for Co-Treatment: For patient/therapist safety;Necessary to address cognition/behavior during functional activity   OT goals addressed during  session: ADL's and self-care      AM-PAC PT "6 Clicks" Mobility   Outcome Measure  Help needed turning from your back to your side while in a flat bed without using bedrails?: Total Help needed moving from lying on your back to sitting on the side of a flat bed without using bedrails?: Total Help needed moving to and from a bed to a chair (including a wheelchair)?: Total Help needed standing up from a chair using your arms (e.g., wheelchair or bedside chair)?: A Little Help needed to walk in hospital room?: Total Help needed climbing 3-5 steps with a railing? : Total 6 Click Score: 8    End of Session Equipment Utilized During Treatment: Gait belt Activity Tolerance: Treatment limited secondary to medical complications (Comment) (decr cognition) Patient left: in bed;with call bell/phone within reach;with bed alarm set Nurse Communication: Other (comment) (chair set up with waist chair alarm for later when RN plans to get him OOB) PT Visit Diagnosis: Other abnormalities of gait and mobility (R26.89);Muscle weakness (generalized) (M62.81);Other symptoms and signs involving the nervous system (Z16.967)     Time: 0822-0901 PT Time Calculation (min) (ACUTE ONLY): 39 min  Charges:  $Gait Training: 8-22 mins                      Jerolyn Center, PT Acute Rehabilitation Services  Office [phone]    Zena Amos 01/24/2022, 10:11 AM

## 2022-01-24 NOTE — Progress Notes (Signed)
PROGRESS NOTE                                                                                                                                                                                                             Patient Demographics:    Todd [name], is a 66 y.o. male, DOB - Feb 04, 1956, IZ:[id number]  Outpatient Primary MD for the patient is Charolette Forward, MD    LOS - 3  Admit date - 01/20/2022    Chief Complaint  Patient presents with   Fall       Brief Narrative (HPI from H&P)  66 year old male with past medical history of hyperlipidemia, hypothyroidism, hypertension and parkinsonism (follows with Dr. Krista Blue with Neurology)  who presents to [facility] emergency department via EMS status post fall.  Patient who lives with his sister claims that he mowed his lawn on Friday morning and became dehydrated and weak, subsequently he went inside his house and sustained a fall, according to the sister he was on the floor for 24 hours in fact she made up a bed for him on the floor so that he could sleep, about 12 hours into the fall he started getting little confused, he was subsequently brought to the ER as he was getting confused and was unable to get off the floor.  In the ER CT head and MRI brain were unremarkable, soon after he was seen by the admitting physician around 2 AM he went into what looks like alcohol withdrawal.  He was started on CIWA protocol.  Also his work-up subsequently came back positive for COVID-19 infection, he is currently admitted for dehydration, generalized weakness, metabolic encephalopathy, possible early DTs and COVID-19 infection.   Subjective:   Patient in bed, less confused today, denies any headache chest or abdominal pain no shortness of breath.   Assessment  & Plan :    Dehydration, mild COVID-19 infection, fall at home with acute metabolic encephalopathy - some cute decline  likely due to acute COVID-19 infection as well, does have mild inflammation as suggested by borderline D-dimer and CRP, chest x-ray stable and he is not hypoxic, continue to hydrate with IV fluids, trial of IV Decadron, PT OT.  Still extremely weak although eager to go home however reportedly he lives with his sister and she is sick as well.  Thorough work-up including CT, CTA head and neck, MRI brain and EEG unremarkable, stable TSH, B12 and RPR.  Continue supportive care we will also get neurology input as well.   SIRS (systemic inflammatory response syndrome) (HCC) - likely due to COVID-19 infection, stable after IV fluids continue to monitor.  Kindly see above.   Rhabdomyolysis- Patient presenting after lying down in his bedroom for over 24 hours with an markedly elevated creatinine kinase, due to fall and staying on the floor for 24 hours.  Hydrated with IV fluids.  Elevated troponin level not due myocardial infarction - Slightly elevated serial troponins with flat trajectory of elevation, Patient is chest pain-free, EKG nonacute continue to monitor clinically.  Home dose aspirin, beta-blocker and statin resumed for secondary prevention.  Parkinsonism (HCC) - Follows with Dr. Terrace Arabia in the outpatient setting, Trial of Sinemet was recently discontinued earlier in the year due to being ineffective.  Monitor with supportive care.  Essential hypertension - placed on Coreg along with low-dose Catapres for DTs and as needed hydralazine.   Mixed hyperlipidemia - on 10 mg of Lipitor daily and fenofibrate 50 mg daily  Prolonged QT interval - keep potassium around 4 and magnesium around 2, Coreg and monitor.  Avoid QT prolonging medications.  CKD 3B.  Baseline creatinine appears to be close to 1.4.  Monitor.  History of alcohol use.  Denies any excessive use states he drinks 1 bottle of beer every other day, is little inconsistent but since he is more awake alert today I doubt he is in DTs and possibly  truthful about his alcohol intake.  Continue to monitor.  Monitor on CIWA protocol administer Ativan only if he has agitation as he has resting tremors from Parkinson's.       Condition - Extremely Guarded  Family Communication  :  sister [name] [phone] on 01/21/22, no response on the phone on 01/22/2022  Code Status :  Full  Consults  :  None  PUD Prophylaxis : PPI   Procedures  :     CT head, CTA head and neck & MRI brain.  All nonacute.      Disposition Plan  :    Status is: Inpatient  DVT Prophylaxis  :    enoxaparin (LOVENOX) injection 40 mg Start: 01/21/22 0245   Lab Results  Component Value Date   PLT 94 (L) 01/24/2022    Diet :  Diet Order             DIET SOFT Room service appropriate? Yes; Fluid consistency: Thin  Diet effective now                    Inpatient Medications  Scheduled Meds:  aspirin EC  81 mg Oral Daily   atorvastatin  10 mg Oral Daily   carvedilol  3.125 mg Oral Daily   cloNIDine  0.1 mg Oral TID   dexamethasone (DECADRON) injection  6 mg Intravenous Q24H   enoxaparin (LOVENOX) injection  40 mg Subcutaneous Q24H   fenofibrate  54 mg Oral Daily   folic acid  1 mg Oral Daily   LORazepam  0-4 mg Intravenous Q12H   multivitamin with minerals  1 tablet Oral Daily   omega-3 acid ethyl esters  1 g Oral Daily   pantoprazole  40 mg Oral Daily   thiamine  100 mg Oral Daily   Or   thiamine  100 mg Intravenous Daily   Continuous Infusions:   PRN Meds:.acetaminophen **OR**  acetaminophen, albuterol, hydrALAZINE, menthol-cetylpyridinium, polyethylene glycol  Antibiotics  :    Anti-infectives (From admission, onward)    None        Time Spent in minutes  30   Lala Lund M.D on 01/24/2022 at 10:46 AM  To page go to [email or web address]   [facility] -  Office  [phone]  See all Orders from today for further details    Objective:   Vitals:   01/23/22 0824 01/23/22 2213 01/24/22 0521 01/24/22 1003  BP:   121/87 (!) 133/105 110/75  Pulse: 60 63 74 69  Resp:  17 18 17   Temp:  98.6 F (37 C)  98.1 F (36.7 C)  TempSrc:  Oral    SpO2:   98% 94%  Weight:      Height:        Wt Readings from Last 3 Encounters:  01/21/22 80.9 kg  07/13/21 86.2 kg  04/14/21 83 kg     Intake/Output Summary (Last 24 hours) at 01/24/2022 1046 Last data filed at 01/24/2022 0300 Gross per 24 hour  Intake 0 ml  Output 1350 ml  Net -1350 ml     Physical Exam  Awake confused, was standing in the bathroom and had walked there by himself was about to fall, was brought back to the bed with person assistance, no focal deficits, mild baseline tremors due to his Parkinson's [place].AT,PERRAL Supple Neck, No JVD,   Symmetrical Chest wall movement, Good air movement bilaterally, CTAB RRR,No Gallops, Rubs or new Murmurs,  +ve B.Sounds, Abd Soft, No tenderness,   No Cyanosis, Clubbing or edema      Data Review:    CBC Recent Labs  Lab 01/20/22 1522 01/20/22 2102 01/21/22 0450 01/22/22 0631 01/23/22 0646 01/24/22 0248  WBC 5.8  --  5.5 5.5 3.2* 6.0  HGB 16.7 16.0 16.3 15.8 16.2 15.8  HCT 48.5 47.0 47.7 45.5 47.8 45.0  PLT 120*  --  110* 100* 105* 94*  MCV 98.0  --  98.1 97.4 97.2 94.9  MCH 33.7  --  33.5 33.8 32.9 33.3  MCHC 34.4  --  34.2 34.7 33.9 35.1  RDW 11.9  --  11.9 11.9 11.8 11.6  LYMPHSABS 0.7  --  0.6* 0.9 0.5* 0.6*  MONOABS 1.1*  --  0.8 0.6 0.4 0.4  EOSABS 0.0  --  0.0 0.0 0.0 0.0  BASOSABS 0.0  --  0.0 0.0 0.0 0.0    Electrolytes Recent Labs  Lab 01/20/22 1522 01/20/22 2034 01/20/22 2102 01/21/22 0450 01/22/22 0631 01/23/22 0646 01/24/22 0248 01/24/22 0300  NA 135  --  136 135 134* 136 138  --   K 4.3  --  3.9 3.5 4.3 4.9 4.6  --   CL 99  --   --  103 102 105 104  --   CO2 23  --   --  20* 21* 24 22  --   GLUCOSE 101*  --   --  90 97 141* 129*  --   BUN 16  --   --  14 12 16 20   --   CREATININE 1.41*  --   --  1.40* 1.02 1.10 1.06  --   CALCIUM 8.8*  --   --  7.9* 8.4*  8.7* 8.7*  --   AST 78*  --   --  74* 72* 67* 58*  --   ALT 31  --   --  29 28 30 31   --  ALKPHOS 51  --   --  52 43 48 46  --   BILITOT 2.1*  --   --  2.0* 1.9* 2.0* 2.4*  --   ALBUMIN 4.0  --   --  3.5 3.0* 3.2* 3.4*  --   MG  --   --   --  1.7 2.1 2.1 2.1  --   CRP  --   --   --  8.2* 7.8* 4.5* 1.7*  --   DDIMER  --   --   --   --  1.14* 0.79* 0.75*  --   PROCALCITON  --   --   --  0.18  --   --   --   --   LATICACIDVEN  --   --   --  1.1  --   --   --   --   TSH  --  0.857  --   --   --   --   --   --   AMMONIA  --  29  --   --   --   --   --   --   BNP  --   --   --   --  44.3 54.1  --  73.0    No results for input(s): "TSH", "T4TOTAL", "T3FREE", "THYROIDAB" in the last 72 hours.  Invalid input(s): "FREET3"    Radiology Reports EEG adult  Result Date: 01/22/2022 Charlsie Quest, MD     01/22/2022  3:40 PM Patient Name: Tonie Vizcarrondo MRN: [id number] Epilepsy Attending: Charlsie Quest Referring Physician/Provider: Marinda Elk, MD Date: 01/22/2022 Duration: 27.39 mins Patient history: 66yo M with ams. EEG to evaluate for seizure Level of alertness: Awake AEDs during EEG study: None Technical aspects: This EEG study was done with scalp electrodes positioned according to the 10-20 International system of electrode placement. Electrical activity was reviewed with band pass filter of 1-70Hz , sensitivity of 7 uV/mm, display speed of 26mm/sec with a 60Hz  notched filter applied as appropriate. EEG data were recorded continuously and digitally stored.  Video monitoring was available and reviewed as appropriate. Description: The posterior dominant rhythm consists of 8 Hz activity of moderate voltage (25-35 uV) seen predominantly in posterior head regions, symmetric and reactive to eye opening and eye closing. Hyperventilation and photic stimulation were not performed.   IMPRESSION: This study is within normal limits. No seizures or epileptiform discharges were seen throughout the  recording. A normal interictal EEG does not exclude nor support the diagnosis of epilepsy.   MR BRAIN WO CONTRAST  Result Date: 01/20/2022 CLINICAL DATA:  03/22/2022 out of bed, delirium EXAM: MRI HEAD WITHOUT CONTRAST TECHNIQUE: Multiplanar, multiecho pulse sequences of the brain and surrounding structures were obtained without intravenous contrast. COMPARISON:  No prior MRI, correlation is made with CT head and CTA head neck 01/20/2022 FINDINGS: Brain: No restricted diffusion to suggest acute or subacute infarct. No acute hemorrhage, mass, mass effect, or midline shift. No hemosiderin deposition to suggest remote hemorrhage. No hydrocephalus or extra-axial collection. Scattered T2 hyperintense signal in the periventricular white matter, likely the sequela of mild-to-moderate chronic small vessel ischemic disease. Vascular: Normal arterial flow voids. Skull and upper cervical spine: Normal marrow signal. Sinuses/Orbits: Mucous retention cyst in the left maxillary sinus. Mild mucosal thickening in the maxillary sinuses. The orbits are unremarkable. Other: Trace fluid in the right mastoid air cells. IMPRESSION: No acute intracranial process. No evidence of acute or subacute infarct.  Electronically Signed   By: Merilyn Baba M.D.   On: 01/20/2022 22:12   CT ANGIO HEAD NECK W WO CM  Result Date: 01/20/2022 CLINICAL DATA:  Fall EXAM: CT ANGIOGRAPHY HEAD AND NECK TECHNIQUE: Multidetector CT imaging of the head and neck was performed using the standard protocol during bolus administration of intravenous contrast. Multiplanar CT image reconstructions and MIPs were obtained to evaluate the vascular anatomy. Carotid stenosis measurements (when applicable) are obtained utilizing NASCET criteria, using the distal internal carotid diameter as the denominator. RADIATION DOSE REDUCTION: This exam was performed according to the departmental dose-optimization program which includes automated exposure control,  adjustment of the mA and/or kV according to patient size and/or use of iterative reconstruction technique. CONTRAST:  14mL OMNIPAQUE IOHEXOL 350 MG/ML SOLN COMPARISON:  No prior CTA, correlation is made with 01/20/2022 CT head FINDINGS: CT HEAD FINDINGS CT HEAD FINDINGS For noncontrast findings, please see same day CT head. 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 Imaged portion shows no evidence of aneurysm or dissection. No significant stenosis of the arch vessel origins. Right carotid system: No evidence of dissection, occlusion, or hemodynamically significant stenosis (greater than 50%). Left carotid system: No evidence of dissection, occlusion, or hemodynamically significant stenosis (greater than 50%). Retropharyngeal course of the proximal left ICA. Vertebral arteries: No evidence of dissection, occlusion, or hemodynamically significant stenosis (greater than 50%). Skeleton: No acute osseous abnormality. Degenerative changes in the cervical spine Other neck: Negative. Upper chest: No focal pulmonary opacity or pleural effusion. Review of the MIP images confirms the above findings CTA HEAD FINDINGS Anterior circulation: Both internal carotid arteries are patent to the termini, without significant stenosis. A1 segments patent. Normal anterior communicating artery. Anterior cerebral arteries are patent to their distal aspects. No M1 stenosis or occlusion. MCA branches perfused and symmetric. Posterior circulation: Vertebral arteries patent to the vertebrobasilar junction without stenosis. Basilar patent to its distal aspect. Superior cerebellar arteries patent proximally. Patent P1 segments. PCAs perfused to their distal aspects without stenosis. The left posterior communicating artery is patent. Venous sinuses: As permitted by contrast timing, patent. Anatomic variants: None significant. Review of the MIP images confirms  the above findings IMPRESSION: 1.  No intracranial large vessel occlusion or significant stenosis. 2.  No hemodynamically significant stenosis in the neck. Electronically Signed   By: Merilyn Baba M.D.   On: 01/20/2022 21:44   CT Head Wo Contrast  Result Date: 01/20/2022 CLINICAL DATA:  Fall on Friday and unable to get up. EXAM: CT HEAD WITHOUT CONTRAST TECHNIQUE: Contiguous axial images were obtained from the base of the skull through the vertex without intravenous contrast. RADIATION DOSE REDUCTION: This exam was performed according to the departmental dose-optimization program which includes automated exposure control, adjustment of the mA and/or kV according to patient size and/or use of iterative reconstruction technique. COMPARISON:  None Available. FINDINGS: Brain: Ventricles within normal limits. Mild chronic small vessel ischemic change within the deep periventricular white matter regions bilaterally. No mass, hemorrhage, edema or other evidence of acute parenchymal abnormality. No extra-axial hemorrhage. Vascular: Chronic calcified atherosclerotic changes of the large vessels at the skull base. No unexpected hyperdense vessel. Skull: Normal. Negative for fracture or focal lesion. Sinuses/Orbits: No acute finding. Other: None. IMPRESSION: 1. No acute findings. No intracranial mass, hemorrhage or edema. No skull fracture. 2. Mild chronic small vessel ischemic change in the deep periventricular white matter regions. Electronically Signed  By: Franki Cabot M.D.   On: 01/20/2022 17:30   DG Hip Unilat W or Wo Pelvis 2-3 Views Left  Result Date: 01/20/2022 CLINICAL DATA:  Fall, left hip pain EXAM: DG HIP (WITH OR WITHOUT PELVIS) 2-3V LEFT COMPARISON:  None Available. FINDINGS: There is no evidence of hip fracture or dislocation. There is no evidence of significant arthropathy or other focal bone abnormality. IMPRESSION: Negative. Electronically Signed   By: Davina Poke D.O.   On: 01/20/2022 16:28    DG Chest 1 View  Result Date: 01/20/2022 CLINICAL DATA:  Fall EXAM: CHEST  1 VIEW COMPARISON:  None Available. FINDINGS: The heart size and mediastinal contours are within normal limits. Both lungs are clear. The visualized skeletal structures are unremarkable. IMPRESSION: No active disease. Electronically Signed   By: Ronney Asters M.D.   On: 01/20/2022 16:25

## 2022-01-24 NOTE — NC FL2 (Signed)
Early MEDICAID FL2 LEVEL OF CARE SCREENING TOOL     IDENTIFICATION  Patient Name: Todd [name] Birthdate: December 28, 1955 Sex: male Admission Date (Current Location): 01/20/2022  [facility] and [place] Number:  Producer, television/film/video and Address:  [facility], [address] [place], [place] [address]      Provider Number: [id number]  Attending Physician Name and Address:  Leroy Sea, MD  Relative Name and Phone Number:  Claiborne Billings (Sister)   [phone]    Current Level of Care: Hospital Recommended Level of Care: Skilled Nursing Facility Prior Approval Number:    Date Approved/Denied:   PASRR Number: [id number]  Discharge Plan: SNF    Current Diagnoses: Patient Active Problem List   Diagnosis Date Noted   Elevated troponin level not due myocardial infarction 01/21/2022   Rhabdomyolysis 01/21/2022   Acute metabolic encephalopathy 01/21/2022   Fall at home, initial encounter 01/21/2022   SIRS (systemic inflammatory response syndrome) (HCC) 01/21/2022   Mixed hyperlipidemia 01/21/2022   Essential hypertension 01/21/2022   Prolonged QT interval 01/21/2022   Weakness 01/21/2022   Generalized weakness 01/20/2022   Parkinsonism (HCC) 04/14/2021    Orientation RESPIRATION BLADDER Height & Weight      (currently disoriented)  Normal Continent Weight: 178 lb 5.6 oz (80.9 kg) Height:  5\' 8"  (172.7 cm)  BEHAVIORAL SYMPTOMS/MOOD NEUROLOGICAL BOWEL NUTRITION STATUS  Wanderer (easily redirected)   Continent Diet (see d/c summary)  AMBULATORY STATUS COMMUNICATION OF NEEDS Skin   Total Care Verbally Normal                       Personal Care Assistance Level of Assistance  Bathing, Dressing, Feeding Bathing Assistance: Maximum assistance Feeding assistance: Limited assistance Dressing Assistance: Maximum assistance     Functional Limitations Info  Sight, Hearing, Speech Sight Info: Adequate Hearing Info: Adequate Speech  Info: Adequate    SPECIAL CARE FACTORS FREQUENCY  PT (By licensed PT), OT (By licensed OT)     PT Frequency: 5x/ week OT Frequency: 5x/ week            Contractures Contractures Info: Not present    Additional Factors Info  Code Status, Allergies, Isolation Precautions Code Status Info: Full Allergies Info: NKA     Isolation Precautions Info: Air/con= COVID+     Current Medications (01/24/2022):  This is the current hospital active medication list Current Facility-Administered Medications  Medication Dose Route Frequency Provider Last Rate Last Admin   acetaminophen (TYLENOL) tablet 650 mg  650 mg Oral Q6H PRN Shalhoub, 01/26/2022, MD       Or   acetaminophen (TYLENOL) suppository 650 mg  650 mg Rectal Q6H PRN Shalhoub, Deno Lunger, MD       albuterol (PROVENTIL) (2.5 MG/3ML) 0.083% nebulizer solution 2.5 mg  2.5 mg Nebulization Q4H PRN Shalhoub, Deno Lunger, MD       aspirin EC tablet 81 mg  81 mg Oral Daily Deno Lunger, MD   81 mg at 01/24/22 1143   atorvastatin (LIPITOR) tablet 10 mg  10 mg Oral Daily Shalhoub, 01/26/22, MD   10 mg at 01/24/22 1145   carvedilol (COREG) tablet 3.125 mg  3.125 mg Oral Daily 01/26/22 K, MD   3.125 mg at 01/24/22 1145   cloNIDine (CATAPRES) tablet 0.1 mg  0.1 mg Oral TID 01/26/22, MD   0.1 mg at 01/24/22 0817   dexamethasone (DECADRON) injection 6 mg  6 mg  Intravenous Q24H Leroy Sea, MD   6 mg at 01/24/22 1147   enoxaparin (LOVENOX) injection 40 mg  40 mg Subcutaneous Q24H Marinda Elk, MD   40 mg at 01/24/22 0076   fenofibrate tablet 54 mg  54 mg Oral Daily Leroy Sea, MD   54 mg at 01/24/22 1145   folic acid (FOLVITE) tablet 1 mg  1 mg Oral Daily Shalhoub, Deno Lunger, MD   1 mg at 01/24/22 1144   hydrALAZINE (APRESOLINE) injection 10 mg  10 mg Intravenous Q6H PRN Shalhoub, Deno Lunger, MD       LORazepam (ATIVAN) injection 0-4 mg  0-4 mg Intravenous Q12H Shalhoub, Deno Lunger, MD   1 mg at 01/24/22 2263    menthol-cetylpyridinium (CEPACOL) lozenge 3 mg  1 lozenge Oral PRN Leroy Sea, MD   3 mg at 01/22/22 2201   multivitamin with minerals tablet 1 tablet  1 tablet Oral Daily Shalhoub, Deno Lunger, MD   1 tablet at 01/24/22 1145   omega-3 acid ethyl esters (LOVAZA) capsule 1 g  1 g Oral Daily Leroy Sea, MD   1 g at 01/24/22 1145   pantoprazole (PROTONIX) EC tablet 40 mg  40 mg Oral Daily Leroy Sea, MD   40 mg at 01/24/22 1145   polyethylene glycol (MIRALAX / GLYCOLAX) packet 17 g  17 g Oral Daily PRN Shalhoub, Deno Lunger, MD       thiamine (VITAMIN B1) tablet 100 mg  100 mg Oral Daily Shalhoub, Deno Lunger, MD   100 mg at 01/24/22 1144   Or   thiamine (VITAMIN B1) injection 100 mg  100 mg Intravenous Daily Shalhoub, Deno Lunger, MD         Discharge Medications: Please see discharge summary for a list of discharge medications.  Relevant Imaging Results:  Relevant Lab Results:   Additional Information SSN: [id number]; 5\' 8"  178lbs  Starlynn Klinkner F Lateia Fraser, LCSWA

## 2022-01-24 NOTE — TOC Progression Note (Signed)
Transition of Care [facility]) - Initial/Assessment Note    Patient Details  Name: Todd [name] MRN: [id number] Date of Birth: June 27, 1955  Transition of Care [facility]) CM/SW Contact:    Ralene Bathe, LCSWA Phone Number: 01/24/2022, 12:13 PM  Clinical Narrative:                 CSW received consult for SNF placement.  Due to the patient being disoriented, CSW contacted the patient's sister.  The sister reports that she is in agreement with the patient going to SNF.  The sister reports that she is sick and requested that CSW call the patient's niece, who is also listed as a contact in the patient's chart.  CSW contacted the niece who reiterated what the patient's sister reported.  The family does not have a facility choice.  CSW explained the insurance authorization process and will begin the search for SNF facilities in the area.    Expected Discharge Plan: Home w Home Health Services Barriers to Discharge: Continued Medical Work up   Patient Goals and CMS Choice Patient states their goals for this hospitalization and ongoing recovery are:: To return home CMS Medicare.gov Compare Post Acute Care list provided to:: Patient Choice offered to / list presented to : Patient  Expected Discharge Plan and Services Expected Discharge Plan: Home w Home Health Services   Discharge Planning Services: CM Consult Post Acute Care Choice: Home Health Living arrangements for the past 2 months: Single Family Home                 DME Arranged: N/A DME Agency: NA       HH Arranged: PT, OT HH Agency: [facility] Date HH Agency Contacted: 01/23/22 Time HH Agency Contacted: 1325 Representative spoke with at [facility] Agency: Tresa Endo  Prior Living Arrangements/Services Living arrangements for the past 2 months: Single Family Home   Patient language and need for interpreter reviewed:: Yes Do you feel safe going back to the place where you live?: Yes      Need for Family Participation in Patient  Care: Yes (Comment) Care giver support system in place?: Yes (comment)   Criminal Activity/Legal Involvement Pertinent to Current Situation/Hospitalization: No - Comment as needed  Activities of Daily Living      Permission Sought/Granted Permission sought to share information with : Case Manager, Magazine features editor, Family Supports Permission granted to share information with : Yes, Verbal Permission Granted              Emotional Assessment Appearance:: Appears stated age Attitude/Demeanor/Rapport: Engaged, Gracious Affect (typically observed): Accepting, Appropriate, Calm, Hopeful, Pleasant Orientation: : Oriented to Self Alcohol / Substance Use: Illicit Drugs (THC) Psych Involvement: No (comment)  Admission diagnosis:  Acute delirium [R41.0] Weakness [R53.1] Fall, initial encounter [W19.XXXA] Unable to ambulate [R26.2] Traumatic rhabdomyolysis, initial encounter (HCC) [T79.6XXA] Patient Active Problem List   Diagnosis Date Noted   Elevated troponin level not due myocardial infarction 01/21/2022   Rhabdomyolysis 01/21/2022   Acute metabolic encephalopathy 01/21/2022   Fall at home, initial encounter 01/21/2022   SIRS (systemic inflammatory response syndrome) (HCC) 01/21/2022   Mixed hyperlipidemia 01/21/2022   Essential hypertension 01/21/2022   Prolonged QT interval 01/21/2022   Weakness 01/21/2022   Generalized weakness 01/20/2022   Parkinsonism (HCC) 04/14/2021   PCP:  Rinaldo Cloud, MD Pharmacy:   [facility] [address], [place] - [address] [place] [address] Phone: [phone] Fax: [phone]     Social Determinants  of Health (SDOH) Interventions    Readmission Risk Interventions     No data to display

## 2022-01-25 DIAGNOSIS — G9341 Metabolic encephalopathy: Secondary | ICD-10-CM | POA: Diagnosis not present

## 2022-01-25 LAB — COMPREHENSIVE METABOLIC PANEL
ALT: 37 U/L (ref 0–44)
AST: 56 U/L — ABNORMAL HIGH (ref 15–41)
Albumin: 3.6 g/dL (ref 3.5–5.0)
Alkaline Phosphatase: 51 U/L (ref 38–126)
Anion gap: 9 (ref 5–15)
BUN: 22 mg/dL (ref 8–23)
CO2: 26 mmol/L (ref 22–32)
Calcium: 8.9 mg/dL (ref 8.9–10.3)
Chloride: 105 mmol/L (ref 98–111)
Creatinine, Ser: 1.17 mg/dL (ref 0.61–1.24)
GFR, Estimated: >60 mL/min (ref >60)
Glucose, Bld: 110 mg/dL — ABNORMAL HIGH (ref 70–99)
Potassium: 3.8 mmol/L (ref 3.5–5.1)
Sodium: 140 mmol/L (ref 135–145)
Total Bilirubin: 2.4 mg/dL — ABNORMAL HIGH (ref 0.3–1.2)
Total Protein: 6.4 g/dL — ABNORMAL LOW (ref 6.5–8.1)

## 2022-01-25 LAB — CBC WITH DIFFERENTIAL/PLATELET
Abs Immature Granulocytes: 0.03 10*3/uL (ref 0.00–0.07)
Basophils Absolute: 0 10*3/uL (ref 0.0–0.1)
Basophils Relative: 0 %
Eosinophils Absolute: 0 10*3/uL (ref 0.0–0.5)
Eosinophils Relative: 0 %
HCT: 46.4 % (ref 39.0–52.0)
Hemoglobin: 16.2 g/dL (ref 13.0–17.0)
Immature Granulocytes: 0 %
Lymphocytes Relative: 10 %
Lymphs Abs: 0.9 10*3/uL (ref 0.7–4.0)
MCH: 33.1 pg (ref 26.0–34.0)
MCHC: 34.9 g/dL (ref 30.0–36.0)
MCV: 94.9 fL (ref 80.0–100.0)
Monocytes Absolute: 1.1 10*3/uL — ABNORMAL HIGH (ref 0.1–1.0)
Monocytes Relative: 11 %
Neutro Abs: 7.6 10*3/uL (ref 1.7–7.7)
Neutrophils Relative %: 79 %
Platelets: 137 10*3/uL — ABNORMAL LOW (ref 150–400)
RBC: 4.89 MIL/uL (ref 4.22–5.81)
RDW: 11.6 % (ref 11.5–15.5)
WBC: 9.7 10*3/uL (ref 4.0–10.5)
nRBC: 0 % (ref 0.0–0.2)

## 2022-01-25 LAB — MAGNESIUM: Magnesium: 2.1 mg/dL (ref 1.7–2.4)

## 2022-01-25 LAB — D-DIMER, QUANTITATIVE: D-Dimer, Quant: 0.59 ug/mL-FEU — ABNORMAL HIGH (ref 0.00–0.50)

## 2022-01-25 LAB — C-REACTIVE PROTEIN: CRP: 0.8 mg/dL (ref <1.0)

## 2022-01-25 LAB — BRAIN NATRIURETIC PEPTIDE: B Natriuretic Peptide: 59.6 pg/mL (ref 0.0–100.0)

## 2022-01-25 MED ORDER — QUETIAPINE FUMARATE 25 MG PO TABS
25.0000 mg | ORAL_TABLET | Freq: Two times a day (BID) | ORAL | Status: DC
  Administered 2022-01-25 (×2): 25 mg via ORAL
  Filled 2022-01-25 (×3): qty 1

## 2022-01-25 NOTE — Plan of Care (Signed)
  Problem: Clinical Measurements: Goal: Respiratory complications will improve Outcome: Progressing Goal: Cardiovascular complication will be avoided Outcome: Progressing   Problem: Elimination: Goal: Will not experience complications related to urinary retention Outcome: Progressing   Problem: Pain Managment: Goal: General experience of comfort will improve Outcome: Progressing   Problem: Activity: Goal: Risk for activity intolerance will decrease Outcome: Not Progressing

## 2022-01-25 NOTE — TOC CAGE-AID Note (Signed)
Transition of Care [facility]) - CAGE-AID Screening   Patient Details  Name: Todd [name] MRN: [id number] Date of Birth: 1955/07/27  Transition of Care [facility]) CM/SW Contact:    Ralene Bathe, LCSWA Phone Number: 01/25/2022, 2:28 PM   Clinical Narrative: Patient unable to participate due to altered mental status.   CAGE-AID Screening: Substance Abuse Screening unable to be completed due to: : Patient unable to participate             Substance Abuse Education Offered: No

## 2022-01-25 NOTE — TOC Progression Note (Signed)
Transition of Care [facility]) - Initial/Assessment Note    Patient Details  Name: Todd [name] MRN: [id number] Date of Birth: 07/30/1955  Transition of Care [facility]) CM/SW Contact:    Ralene Bathe, LCSWA Phone Number: 01/25/2022, 10:04 AM  Clinical Narrative:                 CSW provided the patient's niece with a list of bed offers and is awaiting the family's choice as the patient is still disoriented.     Expected Discharge Plan: Home w Home Health Services Barriers to Discharge: Continued Medical Work up   Patient Goals and CMS Choice Patient states their goals for this hospitalization and ongoing recovery are:: To return home CMS Medicare.gov Compare Post Acute Care list provided to:: Patient Choice offered to / list presented to : Patient  Expected Discharge Plan and Services Expected Discharge Plan: Home w Home Health Services   Discharge Planning Services: CM Consult Post Acute Care Choice: Home Health Living arrangements for the past 2 months: Single Family Home                 DME Arranged: N/A DME Agency: NA       HH Arranged: PT, OT HH Agency: [facility] Date HH Agency Contacted: 01/23/22 Time HH Agency Contacted: 1325 Representative spoke with at [facility] Agency: Tresa Endo  Prior Living Arrangements/Services Living arrangements for the past 2 months: Single Family Home   Patient language and need for interpreter reviewed:: Yes Do you feel safe going back to the place where you live?: Yes      Need for Family Participation in Patient Care: Yes (Comment) Care giver support system in place?: Yes (comment)   Criminal Activity/Legal Involvement Pertinent to Current Situation/Hospitalization: No - Comment as needed  Activities of Daily Living      Permission Sought/Granted Permission sought to share information with : Case Manager, Magazine features editor, Family Supports Permission granted to share information with : Yes, Verbal Permission  Granted              Emotional Assessment Appearance:: Appears stated age Attitude/Demeanor/Rapport: Engaged, Gracious Affect (typically observed): Accepting, Appropriate, Calm, Hopeful, Pleasant Orientation: : Oriented to Self Alcohol / Substance Use: Illicit Drugs (THC) Psych Involvement: No (comment)  Admission diagnosis:  Acute delirium [R41.0] Weakness [R53.1] Fall, initial encounter [W19.XXXA] Unable to ambulate [R26.2] Traumatic rhabdomyolysis, initial encounter (HCC) [T79.6XXA] Patient Active Problem List   Diagnosis Date Noted   Elevated troponin level not due myocardial infarction 01/21/2022   Rhabdomyolysis 01/21/2022   Acute metabolic encephalopathy 01/21/2022   Fall at home, initial encounter 01/21/2022   SIRS (systemic inflammatory response syndrome) (HCC) 01/21/2022   Mixed hyperlipidemia 01/21/2022   Essential hypertension 01/21/2022   Prolonged QT interval 01/21/2022   Weakness 01/21/2022   Generalized weakness 01/20/2022   Parkinsonism (HCC) 04/14/2021   PCP:  Rinaldo Cloud, MD Pharmacy:   [facility] [address] [place] - [address] [place] [address] Phone: [phone] Fax: [phone]     Social Determinants of Health (SDOH) Interventions    Readmission Risk Interventions     No data to display

## 2022-01-25 NOTE — Progress Notes (Signed)
PROGRESS NOTE                                                                                                                                                                                                             Patient Demographics:    Todd [name], is a 66 y.o. male, DOB - [birth year], JIR:[id number]  Outpatient Primary MD for the patient is Rinaldo Cloud, MD    LOS - 4  Admit date - 01/20/2022    Chief Complaint  Patient presents with   Fall       Brief Narrative (HPI from H&P)  66 year old male with past medical history of hyperlipidemia, hypothyroidism, hypertension and parkinsonism (follows with Dr. Terrace Arabia with Neurology)  who presents to [facility] emergency department via EMS status post fall.  Patient who lives with his sister claims that he mowed his lawn on Friday morning and became dehydrated and weak, subsequently he went inside his house and sustained a fall, according to the sister he was on the floor for 24 hours in fact she made up a bed for him on the floor so that he could sleep, about 12 hours into the fall he started getting little confused, he was subsequently brought to the ER as he was getting confused and was unable to get off the floor.  In the ER CT head and MRI brain were unremarkable, soon after he was seen by the admitting physician around 2 AM he went into what looks like alcohol withdrawal.  He was started on CIWA protocol.  Also his work-up subsequently came back positive for COVID-19 infection, he is currently admitted for dehydration, generalized weakness, metabolic encephalopathy, possible early DTs and COVID-19 infection.   Subjective:   Patient in bed, appears comfortable, denies any headache, no fever, no chest pain or pressure, no shortness of breath , no abdominal pain. No new focal weakness.   Assessment  & Plan :    Dehydration, mild COVID-19 infection, fall at home  with acute metabolic encephalopathy - some cute decline likely due to acute COVID-19 infection as well, does have mild inflammation as suggested by borderline D-dimer and CRP, chest x-ray stable and he is not hypoxic, continue to hydrate with IV fluids, trial of IV Decadron, PT OT.  Still extremely weak although eager to go home however reportedly he lives  with his sister and she is sick as well.  Thorough work-up including CT, CTA head and neck, MRI brain and EEG unremarkable, stable TSH, B12 and RPR.  At low-dose Seroquel twice daily on 01/25/2022 to assist with encephalopathy and agitation and monitor.  Still significantly weak physically requiring PT OT, may require SNF.  Social work requested to start working on discharge.   SIRS (systemic inflammatory response syndrome) (HCC) - likely due to COVID-19 infection, stable after IV fluids continue to monitor.  Kindly see above.   Rhabdomyolysis- Patient presenting after lying down in his bedroom for over 24 hours with an markedly elevated creatinine kinase, due to fall and staying on the floor for 24 hours.  Hydrated with IV fluids.  Elevated troponin level not due myocardial infarction - Slightly elevated serial troponins with flat trajectory of elevation, Patient is chest pain-free, EKG nonacute continue to monitor clinically.  Home dose aspirin, beta-blocker and statin resumed for secondary prevention.  Parkinsonism (HCC) - Follows with Dr. Terrace Arabia in the outpatient setting, Trial of Sinemet was recently discontinued earlier in the year due to being ineffective.  Monitor with supportive care.  Essential hypertension - placed on Coreg along with low-dose Catapres for DTs and as needed hydralazine.   Mixed hyperlipidemia - on 10 mg of Lipitor daily and fenofibrate 50 mg daily  Prolonged QT interval - keep potassium around 4 and magnesium around 2, Coreg and monitor.  Avoid QT prolonging medications.  CKD 3B.  Baseline creatinine appears to be close to  1.4.  Monitor.  History of alcohol use.  Denies any excessive use states he drinks 1 bottle of beer every other day, is little inconsistent but since he is more awake alert today I doubt he is in DTs and possibly truthful about his alcohol intake.  Continue to monitor.  Monitor on CIWA protocol administer Ativan only if he has agitation as he has resting tremors from Parkinson's.       Condition - Extremely Guarded  Family Communication  :  sister [name] [phone] on 01/21/22, no response on the phone on 01/22/2022, 01/25/22  Code Status :  Full  Consults  :  None  PUD Prophylaxis : PPI   Procedures  :     CT head, CTA head and neck & MRI brain.  All nonacute.      Disposition Plan  :    Status is: Inpatient  DVT Prophylaxis  :    enoxaparin (LOVENOX) injection 40 mg Start: 01/21/22 0245   Lab Results  Component Value Date   PLT 137 (L) 01/25/2022    Diet :  Diet Order             DIET SOFT Room service appropriate? Yes; Fluid consistency: Thin  Diet effective now                    Inpatient Medications  Scheduled Meds:  aspirin EC  81 mg Oral Daily   atorvastatin  10 mg Oral Daily   carvedilol  3.125 mg Oral Daily   cloNIDine  0.1 mg Oral TID   dexamethasone (DECADRON) injection  6 mg Intravenous Q24H   enoxaparin (LOVENOX) injection  40 mg Subcutaneous Q24H   fenofibrate  54 mg Oral Daily   folic acid  1 mg Oral Daily   multivitamin with minerals  1 tablet Oral Daily   omega-3 acid ethyl esters  1 g Oral Daily   pantoprazole  40 mg Oral Daily  thiamine  100 mg Oral Daily   Or   thiamine  100 mg Intravenous Daily   Continuous Infusions:   PRN Meds:.acetaminophen **OR** acetaminophen, albuterol, haloperidol lactate, hydrALAZINE, menthol-cetylpyridinium, polyethylene glycol  Antibiotics  :    Anti-infectives (From admission, onward)    None        Time Spent in minutes  30   Susa Raring M.D on 01/25/2022 at 9:19 AM  To page go  to [email or web address]   [facility] -  Office  [phone]  See all Orders from today for further details    Objective:   Vitals:   01/24/22 1700 01/24/22 2109 01/25/22 0507 01/25/22 0515  BP: (!) 123/94 (!) 139/96 108/79   Pulse: 72 76 72   Resp:  20 18   Temp: (!) 97.5 F (36.4 C) 97.6 F (36.4 C) (!) 97.5 F (36.4 C)   TempSrc: Oral Oral Oral   SpO2: 95% 96%  99%  Weight:  77 kg    Height:        Wt Readings from Last 3 Encounters:  01/24/22 77 kg  07/13/21 86.2 kg  04/14/21 83 kg     Intake/Output Summary (Last 24 hours) at 01/25/2022 0919 Last data filed at 01/25/2022 0600 Gross per 24 hour  Intake 720 ml  Output 800 ml  Net -80 ml     Physical Exam  Awake but confused, was standing in the bathroom and had walked there by himself was about to fall, was brought back to the bed with person assistance, no focal deficits, mild baseline tremors due to his Parkinson's [place].AT,PERRAL Supple Neck, No JVD,   Symmetrical Chest wall movement, Good air movement bilaterally, CTAB RRR,No Gallops, Rubs or new Murmurs,  +ve B.Sounds, Abd Soft, No tenderness,   No Cyanosis, Clubbing or edema     Data Review:    CBC Recent Labs  Lab 01/21/22 0450 01/22/22 0631 01/23/22 0646 01/24/22 0248 01/25/22 0333  WBC 5.5 5.5 3.2* 6.0 9.7  HGB 16.3 15.8 16.2 15.8 16.2  HCT 47.7 45.5 47.8 45.0 46.4  PLT 110* 100* 105* 94* 137*  MCV 98.1 97.4 97.2 94.9 94.9  MCH 33.5 33.8 32.9 33.3 33.1  MCHC 34.2 34.7 33.9 35.1 34.9  RDW 11.9 11.9 11.8 11.6 11.6  LYMPHSABS 0.6* 0.9 0.5* 0.6* 0.9  MONOABS 0.8 0.6 0.4 0.4 1.1*  EOSABS 0.0 0.0 0.0 0.0 0.0  BASOSABS 0.0 0.0 0.0 0.0 0.0    Electrolytes Recent Labs  Lab 01/20/22 2034 01/20/22 2102 01/21/22 0450 01/22/22 0631 01/23/22 0646 01/24/22 0248 01/24/22 0300 01/25/22 0333  NA  --    < > 135 134* 136 138  --  140  K  --    < > 3.5 4.3 4.9 4.6  --  3.8  CL  --   --  103 102 105 104  --  105  CO2  --   --  20* 21* 24 22   --  26  GLUCOSE  --   --  90 97 141* 129*  --  110*  BUN  --   --  14 12 16 20   --  22  CREATININE  --   --  1.40* 1.02 1.10 1.06  --  1.17  CALCIUM  --   --  7.9* 8.4* 8.7* 8.7*  --  8.9  AST  --   --  74* 72* 67* 58*  --  56*  ALT  --   --  29 28  30 31  --  37  ALKPHOS  --   --  52 43 48 46  --  51  BILITOT  --   --  2.0* 1.9* 2.0* 2.4*  --  2.4*  ALBUMIN  --   --  3.5 3.0* 3.2* 3.4*  --  3.6  MG  --   --  1.7 2.1 2.1 2.1  --  2.1  CRP  --   --  8.2* 7.8* 4.5* 1.7*  --  0.8  DDIMER  --   --   --  1.14* 0.79* 0.75*  --  0.59*  PROCALCITON  --   --  0.18  --   --   --   --   --   LATICACIDVEN  --   --  1.1  --   --   --   --   --   TSH 0.857  --   --   --   --   --   --   --   AMMONIA 29  --   --   --   --   --   --   --   BNP  --   --   --  44.3 54.1  --  73.0 59.6   < > = values in this interval not displayed.    No results for input(s): "TSH", "T4TOTAL", "T3FREE", "THYROIDAB" in the last 72 hours.  Invalid input(s): "FREET3"    Radiology Reports EEG adult  Result Date: 01/22/2022 Charlsie Quest, MD     01/22/2022  3:40 PM Patient Name: Que Meneely MRN: [id number] Epilepsy Attending: Charlsie Quest Referring Physician/Provider: Marinda Elk, MD Date: 01/22/2022 Duration: 27.39 mins Patient history: 66yo M with ams. EEG to evaluate for seizure Level of alertness: Awake AEDs during EEG study: None Technical aspects: This EEG study was done with scalp electrodes positioned according to the 10-20 International system of electrode placement. Electrical activity was reviewed with band pass filter of 1-70Hz , sensitivity of 7 uV/mm, display speed of 71mm/sec with a 60Hz  notched filter applied as appropriate. EEG data were recorded continuously and digitally stored.  Video monitoring was available and reviewed as appropriate. Description: The posterior dominant rhythm consists of 8 Hz activity of moderate voltage (25-35 uV) seen predominantly in posterior head regions, symmetric  and reactive to eye opening and eye closing. Hyperventilation and photic stimulation were not performed.   IMPRESSION: This study is within normal limits. No seizures or epileptiform discharges were seen throughout the recording. A normal interictal EEG does not exclude nor support the diagnosis of epilepsy. Priyanka 

## 2022-01-26 ENCOUNTER — Inpatient Hospital Stay (HOSPITAL_COMMUNITY): Payer: PPO

## 2022-01-26 DIAGNOSIS — G9341 Metabolic encephalopathy: Secondary | ICD-10-CM | POA: Diagnosis not present

## 2022-01-26 LAB — URINALYSIS, ROUTINE W REFLEX MICROSCOPIC
Bilirubin Urine: NEGATIVE
Glucose, UA: NEGATIVE mg/dL
Ketones, ur: 5 mg/dL — AB
Nitrite: POSITIVE — AB
Protein, ur: 100 mg/dL — AB
Specific Gravity, Urine: 1.024 (ref 1.005–1.030)
WBC, UA: >50 WBC/hpf — ABNORMAL HIGH (ref 0–5)
pH: 6 (ref 5.0–8.0)

## 2022-01-26 LAB — CBC WITH DIFFERENTIAL/PLATELET
Abs Immature Granulocytes: 0.08 10*3/uL — ABNORMAL HIGH (ref 0.00–0.07)
Basophils Absolute: 0 10*3/uL (ref 0.0–0.1)
Basophils Relative: 0 %
Eosinophils Absolute: 0 10*3/uL (ref 0.0–0.5)
Eosinophils Relative: 0 %
HCT: 46.1 % (ref 39.0–52.0)
Hemoglobin: 16 g/dL (ref 13.0–17.0)
Immature Granulocytes: 1 %
Lymphocytes Relative: 5 %
Lymphs Abs: 0.9 10*3/uL (ref 0.7–4.0)
MCH: 33.1 pg (ref 26.0–34.0)
MCHC: 34.7 g/dL (ref 30.0–36.0)
MCV: 95.4 fL (ref 80.0–100.0)
Monocytes Absolute: 1.2 10*3/uL — ABNORMAL HIGH (ref 0.1–1.0)
Monocytes Relative: 7 %
Neutro Abs: 14.5 10*3/uL — ABNORMAL HIGH (ref 1.7–7.7)
Neutrophils Relative %: 87 %
Platelets: 128 10*3/uL — ABNORMAL LOW (ref 150–400)
RBC: 4.83 MIL/uL (ref 4.22–5.81)
RDW: 11.6 % (ref 11.5–15.5)
WBC: 16.7 10*3/uL — ABNORMAL HIGH (ref 4.0–10.5)
nRBC: 0 % (ref 0.0–0.2)

## 2022-01-26 LAB — CULTURE, BLOOD (ROUTINE X 2)
Culture: NO GROWTH
Special Requests: ADEQUATE

## 2022-01-26 LAB — PROCALCITONIN: Procalcitonin: 0.36 ng/mL

## 2022-01-26 LAB — COMPREHENSIVE METABOLIC PANEL
ALT: 62 U/L — ABNORMAL HIGH (ref 0–44)
AST: 65 U/L — ABNORMAL HIGH (ref 15–41)
Albumin: 3.4 g/dL — ABNORMAL LOW (ref 3.5–5.0)
Alkaline Phosphatase: 51 U/L (ref 38–126)
Anion gap: 12 (ref 5–15)
BUN: 22 mg/dL (ref 8–23)
CO2: 25 mmol/L (ref 22–32)
Calcium: 8.7 mg/dL — ABNORMAL LOW (ref 8.9–10.3)
Chloride: 101 mmol/L (ref 98–111)
Creatinine, Ser: 1.28 mg/dL — ABNORMAL HIGH (ref 0.61–1.24)
GFR, Estimated: >60 mL/min (ref >60)
Glucose, Bld: 101 mg/dL — ABNORMAL HIGH (ref 70–99)
Potassium: 3.4 mmol/L — ABNORMAL LOW (ref 3.5–5.1)
Sodium: 138 mmol/L (ref 135–145)
Total Bilirubin: 2.5 mg/dL — ABNORMAL HIGH (ref 0.3–1.2)
Total Protein: 6.4 g/dL — ABNORMAL LOW (ref 6.5–8.1)

## 2022-01-26 LAB — D-DIMER, QUANTITATIVE: D-Dimer, Quant: 0.53 ug/mL-FEU — ABNORMAL HIGH (ref 0.00–0.50)

## 2022-01-26 LAB — C-REACTIVE PROTEIN: CRP: 10.8 mg/dL — ABNORMAL HIGH (ref <1.0)

## 2022-01-26 MED ORDER — SODIUM CHLORIDE 0.9 % IV SOLN
3.0000 g | Freq: Four times a day (QID) | INTRAVENOUS | Status: DC
  Administered 2022-01-26 (×3): 3 g via INTRAVENOUS
  Filled 2022-01-26 (×5): qty 8

## 2022-01-26 MED ORDER — POTASSIUM CHLORIDE CRYS ER 20 MEQ PO TBCR
40.0000 meq | EXTENDED_RELEASE_TABLET | Freq: Once | ORAL | Status: AC
  Administered 2022-01-26: 40 meq via ORAL
  Filled 2022-01-26: qty 2

## 2022-01-26 MED ORDER — QUETIAPINE FUMARATE 50 MG PO TABS
50.0000 mg | ORAL_TABLET | Freq: Every day | ORAL | Status: DC
  Filled 2022-01-26: qty 1

## 2022-01-26 MED ORDER — CARVEDILOL 6.25 MG PO TABS
6.2500 mg | ORAL_TABLET | Freq: Every day | ORAL | Status: DC
  Administered 2022-01-26 – 2022-01-29 (×4): 6.25 mg via ORAL
  Filled 2022-01-26 (×4): qty 1

## 2022-01-26 MED ORDER — KCL-LACTATED RINGERS-D5W 20 MEQ/L IV SOLN
INTRAVENOUS | Status: DC
  Filled 2022-01-26 (×2): qty 1000

## 2022-01-26 MED ORDER — CLONIDINE HCL 0.1 MG PO TABS: 0.1000 mg | ORAL_TABLET | Freq: Three times a day (TID) | ORAL | Status: DC | PRN

## 2022-01-26 MED ORDER — QUETIAPINE FUMARATE 25 MG PO TABS
25.0000 mg | ORAL_TABLET | Freq: Every day | ORAL | Status: DC
  Administered 2022-01-26 – 2022-01-27 (×2): 25 mg via ORAL
  Filled 2022-01-26: qty 1

## 2022-01-26 NOTE — TOC Progression Note (Signed)
Transition of Care [facility]) - Initial/Assessment Note    Patient Details  Name: Todd [name] MRN: [id number] Date of Birth: 12/06/1955  [facility] CM/SW Contact:    Milinda Antis, [place] Phone Number: 01/26/2022, 11:02 AM  Clinical Narrative:                 CSW met with the patient's nieces to discuss discharge plans.  The family is considering taking the patient home, but wants to see how the patient progresses.  Family plans to meet with MD in the morning to discuss next steps.    TOC will continue to follow.    Expected Discharge Plan: [place] Barriers to Discharge: Continued Medical Work up   Patient Goals and CMS Choice Patient states their goals for this hospitalization and ongoing recovery are:: To return home CMS Medicare.gov Compare Post Acute Care list provided to:: Patient Choice offered to / list presented to : Patient  Expected Discharge Plan and Services Expected Discharge Plan: [place]   Discharge Planning Services: CM Consult Post Acute Care Choice: [place] arrangements for the past 2 months: Single Family Home                 DME Arranged: N/A DME Agency: NA       HH Arranged: PT, OT HH Agency: [place] Date [place]: 01/23/22 Time [place]: 1325 Representative spoke with at [place]: Claiborne Billings  Prior Living Arrangements/Services Living arrangements for the past 2 months: [place]   Patient language and need for interpreter reviewed:: Yes Do you feel safe going back to the place where you live?: Yes      Need for Family Participation in Patient Care: Yes (Comment) Care giver support system in place?: Yes (comment)   Criminal Activity/Legal Involvement Pertinent to Current Situation/Hospitalization: No - Comment as needed  Activities of Daily Living      Permission Sought/Granted Permission sought to share information with : Case  Manager, Customer service manager, Family Supports Permission granted to share information with : Yes, Verbal Permission Granted              Emotional Assessment Appearance:: Appears stated age Attitude/Demeanor/Rapport: Engaged, Gracious Affect (typically observed): Accepting, Appropriate, Calm, Hopeful, Pleasant Orientation: : Oriented to Self Alcohol / Substance Use: Illicit Drugs (THC) Psych Involvement: No (comment)  Admission diagnosis:  Acute delirium [R41.0] Weakness [R53.1] Fall, initial encounter [W19.XXXA] Unable to ambulate [R26.2] Traumatic rhabdomyolysis, initial encounter ([place]) [T79.6XXA] Patient Active Problem List   Diagnosis Date Noted   Elevated troponin level not due myocardial infarction 01/21/2022   Rhabdomyolysis 03/47/4259   Acute metabolic encephalopathy 56/38/7564   Fall at home, initial encounter 01/21/2022   SIRS (systemic inflammatory response syndrome) ([place]) 01/21/2022   Mixed hyperlipidemia 01/21/2022   Essential hypertension 01/21/2022   Prolonged QT interval 01/21/2022   Weakness 01/21/2022   Generalized weakness 01/20/2022   Parkinsonism ([place]) 04/14/2021   PCP:  Charolette Forward, MD Pharmacy:   [facility] [address] [place] - [address] [place] [address] [place] [address] Phone: [phone] Fax: [phone]     Social Determinants of Health (SDOH) Interventions    Readmission Risk Interventions     No data to display

## 2022-01-26 NOTE — Progress Notes (Signed)
PROGRESS NOTE                                                                                                                                                                                                             Patient Demographics:    Todd [name], is a 66 y.o. male, DOB - 09-03-1955, JXB:[id number]  Outpatient Primary MD for the patient is Rinaldo Cloud, MD    LOS - 5  Admit date - 01/20/2022    Chief Complaint  Patient presents with   Fall       Brief Narrative (HPI from H&P)  66 year old male with past medical history of hyperlipidemia, hypothyroidism, hypertension and parkinsonism (follows with Dr. Terrace Arabia with Neurology)  who presents to [facility] emergency department via EMS status post fall.  Patient who lives with his sister claims that he mowed his lawn on Friday morning and became dehydrated and weak, subsequently he went inside his house and sustained a fall, according to the sister he was on the floor for 24 hours in fact she made up a bed for him on the floor so that he could sleep, about 12 hours into the fall he started getting little confused, he was subsequently brought to the ER as he was getting confused and was unable to get off the floor.  In the ER CT head and MRI brain were unremarkable, soon after he was seen by the admitting physician around 2 AM he went into what looks like alcohol withdrawal.  He was started on CIWA protocol.  Also his work-up subsequently came back positive for COVID-19 infection, he is currently admitted for dehydration, generalized weakness, metabolic encephalopathy, possible early DTs and COVID-19 infection.   Subjective:   Patient in bed, appears comfortable, denies any headache, no fever, no chest pain or pressure, no shortness of breath , no abdominal pain. No focal weakness.   Assessment  & Plan :    Dehydration, mild COVID-19 infection, fall at home with  acute metabolic encephalopathy - some cute decline likely due to acute COVID-19 infection as well, does have mild inflammation as suggested by borderline D-dimer and CRP, chest x-ray stable and he is not hypoxic, continue to hydrate with IV fluids, trial of IV Decadron, PT OT.  Still extremely weak although eager to go home however reportedly he lives with  his sister and she is sick as well.  Thorough work-up including CT, CTA head and neck, MRI brain and EEG unremarkable, stable TSH, B12 and RPR.  At low-dose Seroquel twice daily started on 01/25/2022 dose adjusted on 01/26/2022 to assist with encephalopathy and agitation and monitor.  Still significantly weak physically requiring PT OT, may require SNF.  Social work requested to start working on discharge.  Case discussed with neurologist Dr. Bing Neighbors on 01/26/2022 who reviewed the chart and recommended that she had nothing else to add except for outpatient [facility] neurology follow-up with Dr. Terrace Arabia.  SIRS (systemic inflammatory response syndrome) (HCC) - likely due to COVID-19 infection, stable after IV fluids continue to monitor.  Kindly see above.   Leukocytosis on 01/26/2022.  Febrile but remains at risk for aspiration due to encephalopathy, continue soft diet with feeding assistance aspiration precautions, speech input, short trial of Unasyn.  Chest x-ray appears stable for now.  Rhabdomyolysis- Patient presenting after lying down in his bedroom for over 24 hours with an markedly elevated creatinine kinase, due to fall and staying on the floor for 24 hours.  Hydrated with IV fluids.  Elevated troponin level not due myocardial infarction - Slightly elevated serial troponins with flat trajectory of elevation, Patient is chest pain-free, EKG nonacute continue to monitor clinically.  Home dose aspirin, beta-blocker and statin resumed for secondary prevention.  Parkinsonism (HCC) - Follows with Dr. Terrace Arabia in the outpatient setting, Trial of Sinemet was  recently discontinued earlier in the year due to being ineffective.  Monitor with supportive care.  Essential hypertension - placed on Coreg along with low-dose Catapres for DTs and as needed hydralazine.   Mixed hyperlipidemia - on 10 mg of Lipitor daily and fenofibrate 50 mg daily  Prolonged QT interval - keep potassium around 4 and magnesium around 2, Coreg and monitor.  Avoid QT prolonging medications.  CKD 3B.  Baseline creatinine appears to be close to 1.4.  Monitor.  History of alcohol use.  Denies any excessive use states he drinks 1 bottle of beer every other day, is little inconsistent but since he is more awake alert today I doubt he is in DTs and possibly truthful about his alcohol intake.  Continue to monitor.  Monitor on CIWA protocol administer Ativan only if he has agitation as he has resting tremors from Parkinson's.       Condition - Extremely Guarded  Family Communication  :  sister [name] [phone] on 01/21/22, no response on the phone on 01/22/2022, 01/25/22  Code Status :  Full  Consults  :  Case discussed with neurologist Dr. Bing Neighbors on 01/26/2022 who reviewed the chart and recommended that she had nothing else to add except for outpatient [facility] neurology follow-up with Dr. Terrace Arabia.  PUD Prophylaxis : PPI   Procedures  :     EEG, CT head, CTA head and neck & MRI brain.  All nonacute.      Disposition Plan  :    Status is: Inpatient  DVT Prophylaxis  :    enoxaparin (LOVENOX) injection 40 mg Start: 01/21/22 0245   Lab Results  Component Value Date   PLT 128 (L) 01/26/2022    Diet :  Diet Order             DIET SOFT Room service appropriate? Yes; Fluid consistency: Thin  Diet effective now                    Inpatient Medications  Scheduled Meds:  aspirin EC  81 mg Oral Daily   atorvastatin  10 mg Oral Daily   carvedilol  3.125 mg Oral Daily   cloNIDine  0.1 mg Oral TID   dexamethasone (DECADRON) injection  6 mg Intravenous  Q24H   enoxaparin (LOVENOX) injection  40 mg Subcutaneous Q24H   fenofibrate  54 mg Oral Daily   folic acid  1 mg Oral Daily   multivitamin with minerals  1 tablet Oral Daily   omega-3 acid ethyl esters  1 g Oral Daily   pantoprazole  40 mg Oral Daily   potassium chloride  40 mEq Oral Once   QUEtiapine  25 mg Oral BID   thiamine  100 mg Oral Daily   Or   thiamine  100 mg Intravenous Daily   Continuous Infusions:   PRN Meds:.acetaminophen **OR** acetaminophen, albuterol, haloperidol lactate, hydrALAZINE, menthol-cetylpyridinium, polyethylene glycol  Antibiotics  :    Anti-infectives (From admission, onward)    None        Time Spent in minutes  30   Susa Raring M.D on 01/26/2022 at 9:37 AM  To page go to [email or web address]   [facility] -  Office  [phone]  See all Orders from today for further details    Objective:   Vitals:   01/25/22 0951 01/25/22 1752 01/25/22 2147 01/26/22 0303  BP: 112/82 120/73 (!) 137/118 (!) 147/121  Pulse: 80 86 86 81  Resp: 18 20 20 20   Temp: 98 F (36.7 C) 98.6 F (37 C) 97.9 F (36.6 C) 98.3 F (36.8 C)  TempSrc: Oral Oral Axillary Axillary  SpO2: 98% 98% 93% 97%  Weight:      Height:        Wt Readings from Last 3 Encounters:  01/24/22 77 kg  07/13/21 86.2 kg  04/14/21 83 kg     Intake/Output Summary (Last 24 hours) at 01/26/2022 0937 Last data filed at 01/26/2022 0647 Gross per 24 hour  Intake 920 ml  Output 950 ml  Net -30 ml     Physical Exam  Awake remains confused, no focal deficits, able to respond to basic commands [place].AT,PERRAL Supple Neck, No JVD,   Symmetrical Chest wall movement, Good air movement bilaterally, CTAB RRR,No Gallops, Rubs or new Murmurs,  +ve B.Sounds, Abd Soft, No tenderness,   No Cyanosis, Clubbing or edema      Data Review:    CBC Recent Labs  Lab 01/22/22 0631 01/23/22 0646 01/24/22 0248 01/25/22 0333 01/26/22 0522  WBC 5.5 3.2* 6.0 9.7 16.7*  HGB 15.8 16.2  15.8 16.2 16.0  HCT 45.5 47.8 45.0 46.4 46.1  PLT 100* 105* 94* 137* 128*  MCV 97.4 97.2 94.9 94.9 95.4  MCH 33.8 32.9 33.3 33.1 33.1  MCHC 34.7 33.9 35.1 34.9 34.7  RDW 11.9 11.8 11.6 11.6 11.6  LYMPHSABS 0.9 0.5* 0.6* 0.9 0.9  MONOABS 0.6 0.4 0.4 1.1* 1.2*  EOSABS 0.0 0.0 0.0 0.0 0.0  BASOSABS 0.0 0.0 0.0 0.0 0.0    Electrolytes Recent Labs  Lab 01/20/22 2034 01/20/22 2102 01/21/22 0450 01/22/22 0631 01/23/22 0646 01/24/22 0248 01/24/22 0300 01/25/22 0333 01/26/22 0522  NA  --    < > 135 134* 136 138  --  140 138  K  --    < > 3.5 4.3 4.9 4.6  --  3.8 3.4*  CL  --   --  103 102 105 104  --  105 101  CO2  --   --  20* 21* 24 22  --  26 25  GLUCOSE  --   --  90 97 141* 129*  --  110* 101*  BUN  --   --  14 12 16 20   --  22 22  CREATININE  --   --  1.40* 1.02 1.10 1.06  --  1.17 1.28*  CALCIUM  --   --  7.9* 8.4* 8.7* 8.7*  --  8.9 8.7*  AST  --   --  74* 72* 67* 58*  --  56* 65*  ALT  --   --  29 28 30 31   --  37 62*  ALKPHOS  --   --  52 43 48 46  --  51 51  BILITOT  --   --  2.0* 1.9* 2.0* 2.4*  --  2.4* 2.5*  ALBUMIN  --   --  3.5 3.0* 3.2* 3.4*  --  3.6 3.4*  MG  --   --  1.7 2.1 2.1 2.1  --  2.1  --   CRP  --   --  8.2* 7.8* 4.5* 1.7*  --  0.8  --   DDIMER  --   --   --  1.14* 0.79* 0.75*  --  0.59* 0.53*  PROCALCITON  --   --  0.18  --   --   --   --   --   --   LATICACIDVEN  --   --  1.1  --   --   --   --   --   --   TSH 0.857  --   --   --   --   --   --   --   --   AMMONIA 29  --   --   --   --   --   --   --   --   BNP  --   --   --  44.3 54.1  --  73.0 59.6  --    < > = values in this interval not displayed.    No results for input(s): "TSH", "T4TOTAL", "T3FREE", "THYROIDAB" in the last 72 hours.  Invalid input(s): "FREET3"    Radiology Reports DG Chest Port 1 View  Result Date: 01/26/2022 CLINICAL DATA:  Weakness, confusion and COVID positive. EXAM: PORTABLE CHEST 1 VIEW COMPARISON:  01/20/2022 FINDINGS: Lungs are adequately inflated without  focal airspace consolidation or effusion. Cardiomediastinal silhouette and remainder of the exam is unchanged. IMPRESSION: No active disease. Electronically Signed   By: Elberta Fortisaniel  Boyle M.D.   On: 01/26/2022 08:04   EEG adult  Result Date: 01/22/2022 Charlsie QuestYadav, Priyanka O, MD     01/22/2022  3:40 PM Patient Name: Todd [name] MRN: [id number] Epilepsy Attending: Charlsie QuestPriyanka O Yadav Referring Physician/Provider: Marinda ElkShalhoub, George J, MD Date: 01/22/2022 Duration: 27.39 mins Patient history: 66yo M with ams. EEG to evaluate for seizure Level of alertness: Awake AEDs during EEG study: None Technical aspects: This EEG study was done with scalp electrodes positioned according to the 10-20 International system of electrode placement. Electrical activity was reviewed with band pass filter of 1-70Hz , sensitivity of 7 uV/mm, display speed of 8030mm/sec with a 60Hz  notched filter applied as appropriate. EEG data were recorded continuously and digitally stored.  Video monitoring was available and reviewed as appropriate. Description: The posterior dominant rhythm consists of 8 Hz activity of moderate voltage (25-35 uV) seen predominantly in posterior head regions, symmetric and reactive to eye opening  and eye closing. Hyperventilation and photic stimulation were not performed.   IMPRESSION: This study is within normal limits. No seizures or epileptiform discharges were seen throughout the recording. A normal interictal EEG does not exclude nor support the diagnosis of epilepsy. Priyanka Annabelle Harman

## 2022-01-26 NOTE — Evaluation (Signed)
Clinical/Bedside Swallow Evaluation Patient Details  Name: Todd [name] MRN: [id number] Date of Birth: Dec 16, 1955  Today's Date: 01/26/2022 Time: SLP Start Time (ACUTE ONLY): 1248 SLP Stop Time (ACUTE ONLY): 1303 SLP Time Calculation (min) (ACUTE ONLY): 15 min  Past Medical History:  Past Medical History:  Diagnosis Date   Anxiety    Hypertension    Past Surgical History:  Past Surgical History:  Procedure Laterality Date   NO PAST SURGERIES     HPI:  Pt is a 66 year old male who presented to the ED after fall. MRI brain negative. Alcohol withdrawl suspected by admitting MD and CIWA protocol initiated. Pt admitted for dehydration, generalized weakness, metabolic encephalopathy, possible early DTs and COVID-19 infection. Leukocytosis noted 9/15 and soft diet initiated by MD. PMH: hyperlipidemia, hypothyroidism, hypertension and parkinsonism (follows with Dr. Terrace [name] with Neurology).    Assessment / Plan / Recommendation  Clinical Impression  Pt was seen for bedside swallow evaluation. Pt was unable to provide any meaningful history. Oral mechanism exam was limited due to pt's difficulty following all necessary commands; however, oral motor strength and ROM appeared grossly [facility] and he presented with adequate, natural dentition. He exhibited symptoms of oropharyngeal dysphagia characterized by oral holding, a suspected pharyngeal delay, prolonged mastication, and signs of aspiration with thin liquids and inconsistently with nectar thick liquids via straw and regular textures. SLP suspects that pt's mentation is a contributing factor to his symptoms, but pt also has other risk factors for dysphagia. A dysphagia 3 diet with nectar thick liquids is recommended with observance of swallowing precautions. SLP will follow to assess tolerance and for possible instrumental assessment if pt's symptoms persist. SLP Visit Diagnosis: Dysphagia, unspecified (R13.10)    Aspiration Risk  Mild aspiration risk     Diet Recommendation Dysphagia 3 (Mech soft);Nectar-thick liquid   Liquid Administration via: Cup;No straw Medication Administration: Whole meds with puree Supervision: Full supervision/cueing for compensatory strategies;Staff to assist with self feeding Compensations: Slow rate;Small sips/bites    Other  Recommendations Oral Care Recommendations: Oral care BID    Recommendations for follow up therapy are one component of a multi-disciplinary discharge planning process, led by the attending physician.  Recommendations may be updated based on patient status, additional functional criteria and insurance authorization.  Follow up Recommendations  (TBD)      Assistance Recommended at Discharge Frequent or constant Supervision/Assistance  Functional Status Assessment Patient has had a recent decline in their functional status and demonstrates the ability to make significant improvements in function in a reasonable and predictable amount of time.  Frequency and Duration min 2x/week  2 weeks       Prognosis Prognosis for Safe Diet Advancement: Good Barriers to Reach Goals: Cognitive deficits      Swallow Study   General Date of Onset: 01/25/22 HPI: Pt is a 66 year old male who presented to the ED after fall. MRI brain negative. Alcohol withdrawl suspected by admitting MD and CIWA protocol initiated. Pt admitted for dehydration, generalized weakness, metabolic encephalopathy, possible early DTs and COVID-19 infection. Leukocytosis noted 9/15 and soft diet initiated by MD. PMH: hyperlipidemia, hypothyroidism, hypertension and parkinsonism (follows with Dr. Terrace [name] with Neurology). Type of Study: Bedside Swallow Evaluation Diet Prior to this Study: Dysphagia 3 (soft);Thin liquids Temperature Spikes Noted: No Respiratory Status: Room air History of Recent Intubation: No Behavior/Cognition: Alert;Cooperative;Pleasant mood Oral Cavity Assessment: Within Functional Limits Oral Care Completed  by SLP: No Oral Cavity - Dentition: Adequate natural dentition Self-Feeding Abilities: Total assist Patient Positioning: Upright in  bed;Postural control adequate for testing Baseline Vocal Quality: Normal Volitional Cough: Strong;Congested Volitional Swallow: Able to elicit    Oral/Motor/Sensory Function Overall Oral Motor/Sensory Function: Within functional limits (though limited)   Ice Chips Ice chips: Within functional limits Presentation: Spoon   Thin Liquid Thin Liquid: Impaired Presentation: Cup;Straw Pharyngeal  Phase Impairments: Suspected delayed Swallow;Cough - Immediate;Cough - Delayed    Nectar Thick     Honey Thick Honey Thick Liquid: Not tested   Puree Puree: Impaired Presentation: Spoon Oral Phase Functional Implications: Oral holding Pharyngeal Phase Impairments: Suspected delayed Swallow   Solid     Solid: Impaired Oral Phase Impairments: Impaired mastication Pharyngeal Phase Impairments: Cough - Delayed     Todd Mabey I. Vear Clock, MS, CCC-SLP Acute Rehabilitation Services Office number [phone]  Todd [name] 01/26/2022,1:12 PM

## 2022-01-26 NOTE — Progress Notes (Signed)
Pharmacy Antibiotic Note  Todd [name] is a 66 y.o. male admitted on 01/20/2022 with aspiration PNA  Pharmacy has been consulted for Unasyn dosing.  Plan: Unasyn 3gm IV q6h Monitor for clinical course, Lot, and deescalation  Height: 5\' 8"  (172.7 cm) Weight: 77 kg (169 lb 12.1 oz) IBW/kg (Calculated) : 68.4  Temp (24hrs), Avg:98.2 F (36.8 C), Min:97.9 F (36.6 C), Max:98.6 F (37 C)  Recent Labs  Lab 01/21/22 0450 01/22/22 0631 01/23/22 0646 01/24/22 0248 01/25/22 0333 01/26/22 0522  WBC 5.5 5.5 3.2* 6.0 9.7 16.7*  CREATININE 1.40* 1.02 1.10 1.06 1.17 1.28*  LATICACIDVEN 1.1  --   --   --   --   --     Estimated Creatinine Clearance: 53.4 mL/min (A) (by C-G formula based on SCr of 1.28 mg/dL (H)).    No Known Allergies  Antimicrobials this admission: 9/15 Unasyn >>    Dose adjustments this admission: N/a  Microbiology results: N/a  Todd [name] A. 10/15, PharmD, BCPS, FNKF Clinical Pharmacist [place] Please utilize Amion for appropriate phone number to reach the unit pharmacist [facility])  01/26/2022 10:05 AM

## 2022-01-27 DIAGNOSIS — G9341 Metabolic encephalopathy: Secondary | ICD-10-CM | POA: Diagnosis not present

## 2022-01-27 LAB — CBC WITH DIFFERENTIAL/PLATELET
Abs Immature Granulocytes: 0.07 10*3/uL (ref 0.00–0.07)
Basophils Absolute: 0 10*3/uL (ref 0.0–0.1)
Basophils Relative: 0 %
Eosinophils Absolute: 0 10*3/uL (ref 0.0–0.5)
Eosinophils Relative: 0 %
HCT: 44.3 % (ref 39.0–52.0)
Hemoglobin: 15.1 g/dL (ref 13.0–17.0)
Immature Granulocytes: 1 %
Lymphocytes Relative: 4 %
Lymphs Abs: 0.4 10*3/uL — ABNORMAL LOW (ref 0.7–4.0)
MCH: 33.1 pg (ref 26.0–34.0)
MCHC: 34.1 g/dL (ref 30.0–36.0)
MCV: 97.1 fL (ref 80.0–100.0)
Monocytes Absolute: 0.8 10*3/uL (ref 0.1–1.0)
Monocytes Relative: 9 %
Neutro Abs: 8.2 10*3/uL — ABNORMAL HIGH (ref 1.7–7.7)
Neutrophils Relative %: 86 %
Platelets: 135 10*3/uL — ABNORMAL LOW (ref 150–400)
RBC: 4.56 MIL/uL (ref 4.22–5.81)
RDW: 11.8 % (ref 11.5–15.5)
WBC: 9.5 10*3/uL (ref 4.0–10.5)
nRBC: 0 % (ref 0.0–0.2)

## 2022-01-27 LAB — COMPREHENSIVE METABOLIC PANEL
ALT: 44 U/L (ref 0–44)
AST: 34 U/L (ref 15–41)
Albumin: 3 g/dL — ABNORMAL LOW (ref 3.5–5.0)
Alkaline Phosphatase: 62 U/L (ref 38–126)
Anion gap: 11 (ref 5–15)
BUN: 15 mg/dL (ref 8–23)
CO2: 23 mmol/L (ref 22–32)
Calcium: 8.4 mg/dL — ABNORMAL LOW (ref 8.9–10.3)
Chloride: 105 mmol/L (ref 98–111)
Creatinine, Ser: 1.23 mg/dL (ref 0.61–1.24)
GFR, Estimated: >60 mL/min (ref >60)
Glucose, Bld: 112 mg/dL — ABNORMAL HIGH (ref 70–99)
Potassium: 4 mmol/L (ref 3.5–5.1)
Sodium: 139 mmol/L (ref 135–145)
Total Bilirubin: 2.5 mg/dL — ABNORMAL HIGH (ref 0.3–1.2)
Total Protein: 6.4 g/dL — ABNORMAL LOW (ref 6.5–8.1)

## 2022-01-27 LAB — MRSA NEXT GEN BY PCR, NASAL: MRSA by PCR Next Gen: NOT DETECTED

## 2022-01-27 LAB — MAGNESIUM: Magnesium: 1.9 mg/dL (ref 1.7–2.4)

## 2022-01-27 LAB — C-REACTIVE PROTEIN: CRP: 15.6 mg/dL — ABNORMAL HIGH (ref <1.0)

## 2022-01-27 LAB — BRAIN NATRIURETIC PEPTIDE: B Natriuretic Peptide: 53.2 pg/mL (ref 0.0–100.0)

## 2022-01-27 LAB — PROCALCITONIN: Procalcitonin: 0.46 ng/mL

## 2022-01-27 MED ORDER — SODIUM CHLORIDE 0.9 % IV SOLN
1.0000 g | INTRAVENOUS | Status: AC
  Administered 2022-01-27 – 2022-01-31 (×5): 1 g via INTRAVENOUS
  Filled 2022-01-27 (×5): qty 10

## 2022-01-27 MED ORDER — AMLODIPINE BESYLATE 5 MG PO TABS
5.0000 mg | ORAL_TABLET | Freq: Every day | ORAL | Status: DC
  Administered 2022-01-27 – 2022-01-29 (×3): 5 mg via ORAL
  Filled 2022-01-27 (×3): qty 1

## 2022-01-27 MED ORDER — METRONIDAZOLE 500 MG PO TABS
500.0000 mg | ORAL_TABLET | Freq: Three times a day (TID) | ORAL | Status: AC
  Administered 2022-01-27 – 2022-01-31 (×14): 500 mg via ORAL
  Filled 2022-01-27 (×16): qty 1

## 2022-01-27 NOTE — Progress Notes (Signed)
PROGRESS NOTE                                                                                                                                                                                                             Patient Demographics:    Todd [name], is a 66 y.o. male, DOB - 10/11/1955, WUJ:[id number]  Outpatient Primary MD for the patient is Rinaldo CloudHarwani, Mohan, MD    LOS - 6  Admit date - 01/20/2022    Chief Complaint  Patient presents with   Fall       Brief Narrative (HPI from H&P)  66 year old male with past medical history of hyperlipidemia, hypothyroidism, hypertension and parkinsonism (follows with Dr. Terrace ArabiaYan with Neurology)  who presents to [facility] department via EMS status post fall.  Patient who lives with his sister claims that he mowed his lawn on Friday morning and became dehydrated and weak, subsequently he went inside his house and sustained a fall, according to the sister he was on the floor for 24 hours in fact she made up a bed for him on the floor so that he could sleep, about 12 hours into the fall he started getting little confused, he was subsequently brought to the ER as he was getting confused and was unable to get off the floor.  In the ER CT head and MRI brain were unremarkable, soon after he was seen by the admitting physician around 2 AM he went into what looks like alcohol withdrawal.  He was started on CIWA protocol.  Also his work-up subsequently came back positive for COVID-19 infection, he is currently admitted for dehydration, generalized weakness, metabolic encephalopathy, possible early DTs and COVID-19 infection.   Subjective:   Patient in bed, appears comfortable, denies any headache, no fever, no chest pain or pressure, no shortness of breath , no abdominal pain. No focal weakness.   Assessment  & Plan :    Dehydration, mild COVID-19 infection, fall at home with  acute metabolic encephalopathy - some cute decline likely due to acute COVID-19 infection as well, does have mild inflammation as suggested by borderline D-dimer and CRP, chest x-ray stable and he is not hypoxic, continue to hydrate with IV fluids, trial of IV Decadron, PT OT.  Still extremely weak although eager to go home however reportedly he lives with  his sister and she is sick as well.  Thorough work-up including CT, CTA head and neck, MRI brain and EEG unremarkable, stable TSH, B12 and RPR.  At low-dose Seroquel twice daily started on 01/25/2022 dose adjusted on 01/26/2022 to assist with encephalopathy and agitation and monitor.  Still significantly weak physically requiring PT OT, may require SNF.  Social work requested to start working on discharge.  Case discussed with neurologist Dr. Bing Neighbors on 01/26/2022 who reviewed the chart and recommended that she had nothing else to add except for outpatient [facility] neurology follow-up with Dr. Terrace Arabia.   SIRS (systemic inflammatory response syndrome) (HCC) - likely due to COVID-19 infection, stable after IV fluids continue to monitor.  Kindly see above.   Leukocytosis on 01/26/2022 at risk for aspiration and UA suggestive of UTI.  Febrile but remains at risk for aspiration due to encephalopathy, continue soft diet with feeding assistance aspiration precautions, speech input, short trial of Rocephin and Flagyl, chest x-ray appears stable for now.  Since D-dimer was borderline with acute COVID-19 infection will check lower extremity venous duplex as well.  Rhabdomyolysis- Patient presenting after lying down in his bedroom for over 24 hours with an markedly elevated creatinine kinase, due to fall and staying on the floor for 24 hours.  Hydrated with IV fluids.  Elevated troponin level not due myocardial infarction - Slightly elevated serial troponins with flat trajectory of elevation, Patient is chest pain-free, EKG nonacute continue to monitor clinically.   Home dose aspirin, beta-blocker and statin resumed for secondary prevention.  Parkinsonism (HCC) - Follows with Dr. Terrace Arabia in the outpatient setting, Trial of Sinemet was recently discontinued earlier in the year due to being ineffective.  Monitor with supportive care.  Essential hypertension - placed on Coreg along with low-dose Catapres for DTs and as needed hydralazine.   Mixed hyperlipidemia - on 10 mg of Lipitor daily and fenofibrate 50 mg daily  Prolonged QT interval - keep potassium around 4 and magnesium around 2, Coreg and monitor.  Avoid QT prolonging medications.  CKD 3B.  Baseline creatinine appears to be close to 1.4.  Monitor.  History of alcohol use.  Denies any excessive use states he drinks 1 bottle of beer every other day, is little inconsistent but since he is more awake alert today I doubt he is in DTs and possibly truthful about his alcohol intake.  Continue to monitor.  Monitor on CIWA protocol administer Ativan only if he has agitation as he has resting tremors from Parkinson's.       Condition - Extremely Guarded  Family Communication  :  sister [name] [phone] on 01/21/22, no response on the phone on 01/22/2022, 01/25/22.  Nephew and niece bedside on 01/27/2022.  Code Status :  Full  Consults  :  Case discussed with neurologist Dr. Bing Neighbors on 01/26/2022 who reviewed the chart and recommended that she had nothing else to add except for outpatient [facility] neurology follow-up with Dr. Terrace Arabia.  PUD Prophylaxis : PPI   Procedures  :     EEG, CT head, CTA head and neck & MRI brain.  All nonacute.      Disposition Plan  :    Status is: Inpatient  DVT Prophylaxis  :    enoxaparin (LOVENOX) injection 40 mg Start: 01/21/22 0245   Lab Results  Component Value Date   PLT 128 (L) 01/26/2022    Diet :  Diet Order  DIET DYS 3 Room service appropriate? No; Fluid consistency: Nectar Thick  Diet effective now                    Inpatient  Medications  Scheduled Meds:  amLODipine  5 mg Oral Daily   aspirin EC  81 mg Oral Daily   atorvastatin  10 mg Oral Daily   carvedilol  6.25 mg Oral Daily   enoxaparin (LOVENOX) injection  40 mg Subcutaneous Q24H   fenofibrate  54 mg Oral Daily   folic acid  1 mg Oral Daily   metroNIDAZOLE  500 mg Oral Q8H   multivitamin with minerals  1 tablet Oral Daily   omega-3 acid ethyl esters  1 g Oral Daily   pantoprazole  40 mg Oral Daily   thiamine  100 mg Oral Daily   Or   thiamine  100 mg Intravenous Daily   Continuous Infusions:  cefTRIAXone (ROCEPHIN)  IV 1 g (01/27/22 0727)    PRN Meds:.acetaminophen **OR** acetaminophen, albuterol, cloNIDine, haloperidol lactate, hydrALAZINE, menthol-cetylpyridinium, polyethylene glycol  Antibiotics  :    Anti-infectives (From admission, onward)    Start     Dose/Rate Route Frequency Ordered Stop   01/27/22 0645  cefTRIAXone (ROCEPHIN) 1 g in sodium chloride 0.9 % 100 mL IVPB        1 g 200 mL/hr over 30 Minutes Intravenous Every 24 hours 01/27/22 0549 02/01/22 0644   01/27/22 0645  metroNIDAZOLE (FLAGYL) tablet 500 mg        500 mg Oral Every 8 hours 01/27/22 0549 01/31/22 0559   01/26/22 1100  Ampicillin-Sulbactam (UNASYN) 3 g in sodium chloride 0.9 % 100 mL IVPB  Status:  Discontinued        3 g 200 mL/hr over 30 Minutes Intravenous Every 6 hours 01/26/22 1007 01/27/22 0549        Time Spent in minutes  30   Susa Raring M.D on 01/27/2022 at 10:12 AM  To page go to [email or web address]   [facility] -  Office  [phone]  See all Orders from today for further details    Objective:   Vitals:   01/26/22 2139 01/27/22 0607 01/27/22 0618 01/27/22 0829  BP: 135/88  (!) 153/96 129/75  Pulse: 82 91 95 93  Resp: 19 18 18 17   Temp: 99.4 F (37.4 C) 100.3 F (37.9 C) 100.3 F (37.9 C) (!) 100.6 F (38.1 C)  TempSrc:  Axillary  Oral  SpO2:  100% 96% 93%  Weight:      Height:        Wt Readings from Last 3 Encounters:   01/24/22 77 kg  07/13/21 86.2 kg  04/14/21 83 kg     Intake/Output Summary (Last 24 hours) at 01/27/2022 1012 Last data filed at 01/27/2022 01/29/2022 Gross per 24 hour  Intake 1722.89 ml  Output 1100 ml  Net 622.89 ml     Physical Exam  Awake remains confused, no focal deficits, able to respond to basic commands [place].AT,PERRAL Supple Neck, No JVD,   Symmetrical Chest wall movement, Good air movement bilaterally, CTAB RRR,No Gallops, Rubs or new Murmurs,  +ve B.Sounds, Abd Soft, No tenderness,   No Cyanosis, Clubbing or edema      Data Review:    CBC Recent Labs  Lab 01/22/22 0631 01/23/22 0646 01/24/22 0248 01/25/22 0333 01/26/22 0522  WBC 5.5 3.2* 6.0 9.7 16.7*  HGB 15.8 16.2 15.8 16.2 16.0  HCT 45.5 47.8 45.0 46.4 46.1  PLT 100* 105* 94* 137* 128*  MCV 97.4 97.2 94.9 94.9 95.4  MCH 33.8 32.9 33.3 33.1 33.1  MCHC 34.7 33.9 35.1 34.9 34.7  RDW 11.9 11.8 11.6 11.6 11.6  LYMPHSABS 0.9 0.5* 0.6* 0.9 0.9  MONOABS 0.6 0.4 0.4 1.1* 1.2*  EOSABS 0.0 0.0 0.0 0.0 0.0  BASOSABS 0.0 0.0 0.0 0.0 0.0    Electrolytes Recent Labs  Lab 01/20/22 2034 01/20/22 2102 01/21/22 0450 01/22/22 0631 01/23/22 0646 01/24/22 0248 01/24/22 0300 01/25/22 0333 01/26/22 0522  NA  --    < > 135 134* 136 138  --  140 138  K  --    < > 3.5 4.3 4.9 4.6  --  3.8 3.4*  CL  --   --  103 102 105 104  --  105 101  CO2  --   --  20* 21* 24 22  --  26 25  GLUCOSE  --   --  90 97 141* 129*  --  110* 101*  BUN  --   --  14 12 16 20   --  22 22  CREATININE  --   --  1.40* 1.02 1.10 1.06  --  1.17 1.28*  CALCIUM  --   --  7.9* 8.4* 8.7* 8.7*  --  8.9 8.7*  AST  --   --  74* 72* 67* 58*  --  56* 65*  ALT  --   --  29 28 30 31   --  37 62*  ALKPHOS  --   --  52 43 48 46  --  51 51  BILITOT  --   --  2.0* 1.9* 2.0* 2.4*  --  2.4* 2.5*  ALBUMIN  --   --  3.5 3.0* 3.2* 3.4*  --  3.6 3.4*  MG  --   --  1.7 2.1 2.1 2.1  --  2.1  --   CRP  --    < > 8.2* 7.8* 4.5* 1.7*  --  0.8 10.8*  DDIMER  --   --    --  1.14* 0.79* 0.75*  --  0.59* 0.53*  PROCALCITON  --   --  0.18  --   --   --   --   --  0.36  LATICACIDVEN  --   --  1.1  --   --   --   --   --   --   TSH 0.857  --   --   --   --   --   --   --   --   AMMONIA 29  --   --   --   --   --   --   --   --   BNP  --   --   --  44.3 54.1  --  73.0 59.6  --    < > = values in this interval not displayed.    No results for input(s): "TSH", "T4TOTAL", "T3FREE", "THYROIDAB" in the last 72 hours.  Invalid input(s): "FREET3"  Radiology Reports DG Chest Port 1 View  Result Date: 01/26/2022 CLINICAL DATA:  Weakness, confusion and COVID positive. EXAM: PORTABLE CHEST 1 VIEW COMPARISON:  01/20/2022 FINDINGS: Lungs are adequately inflated without focal airspace consolidation or effusion. Cardiomediastinal silhouette and remainder of the exam is unchanged. IMPRESSION: No active disease. Electronically Signed   By: Marin Olp M.D.   On: 01/26/2022 08:04

## 2022-01-27 NOTE — Plan of Care (Signed)
  Problem: Clinical Measurements: Goal: Respiratory complications will improve Outcome: Progressing   Problem: Elimination: Goal: Will not experience complications related to bowel motility Outcome: Progressing Goal: Will not experience complications related to urinary retention Outcome: Progressing   Problem: Activity: Goal: Risk for activity intolerance will decrease Outcome: Not Progressing  Patient insisted on going to the [facility] and this nurse had to physically move lift the patient would not move feet to turn did not follow directions well had to physically lift off BSC patient would not stand straight up patient required full assist of staff   Problem: Nutrition: Goal: Adequate nutrition will be maintained Outcome: Not Progressing

## 2022-01-28 ENCOUNTER — Inpatient Hospital Stay (HOSPITAL_COMMUNITY): Payer: PPO

## 2022-01-28 DIAGNOSIS — R7989 Other specified abnormal findings of blood chemistry: Secondary | ICD-10-CM | POA: Diagnosis not present

## 2022-01-28 DIAGNOSIS — G9341 Metabolic encephalopathy: Secondary | ICD-10-CM | POA: Diagnosis not present

## 2022-01-28 LAB — CBC WITH DIFFERENTIAL/PLATELET
Abs Immature Granulocytes: 0.08 10*3/uL — ABNORMAL HIGH (ref 0.00–0.07)
Basophils Absolute: 0 10*3/uL (ref 0.0–0.1)
Basophils Relative: 0 %
Eosinophils Absolute: 0 10*3/uL (ref 0.0–0.5)
Eosinophils Relative: 0 %
HCT: 42.7 % (ref 39.0–52.0)
Hemoglobin: 14.7 g/dL (ref 13.0–17.0)
Immature Granulocytes: 1 %
Lymphocytes Relative: 8 %
Lymphs Abs: 0.6 10*3/uL — ABNORMAL LOW (ref 0.7–4.0)
MCH: 33 pg (ref 26.0–34.0)
MCHC: 34.4 g/dL (ref 30.0–36.0)
MCV: 96 fL (ref 80.0–100.0)
Monocytes Absolute: 1.4 10*3/uL — ABNORMAL HIGH (ref 0.1–1.0)
Monocytes Relative: 21 %
Neutro Abs: 4.8 10*3/uL (ref 1.7–7.7)
Neutrophils Relative %: 70 %
Platelets: 159 10*3/uL (ref 150–400)
RBC: 4.45 MIL/uL (ref 4.22–5.81)
RDW: 11.9 % (ref 11.5–15.5)
WBC: 6.9 10*3/uL (ref 4.0–10.5)
nRBC: 0 % (ref 0.0–0.2)

## 2022-01-28 LAB — PROCALCITONIN: Procalcitonin: 0.64 ng/mL

## 2022-01-28 LAB — COMPREHENSIVE METABOLIC PANEL
ALT: 31 U/L (ref 0–44)
AST: 27 U/L (ref 15–41)
Albumin: 2.8 g/dL — ABNORMAL LOW (ref 3.5–5.0)
Alkaline Phosphatase: 51 U/L (ref 38–126)
Anion gap: 9 (ref 5–15)
BUN: 19 mg/dL (ref 8–23)
CO2: 24 mmol/L (ref 22–32)
Calcium: 8.3 mg/dL — ABNORMAL LOW (ref 8.9–10.3)
Chloride: 104 mmol/L (ref 98–111)
Creatinine, Ser: 1.24 mg/dL (ref 0.61–1.24)
GFR, Estimated: >60 mL/min (ref >60)
Glucose, Bld: 121 mg/dL — ABNORMAL HIGH (ref 70–99)
Potassium: 3.6 mmol/L (ref 3.5–5.1)
Sodium: 137 mmol/L (ref 135–145)
Total Bilirubin: 1.9 mg/dL — ABNORMAL HIGH (ref 0.3–1.2)
Total Protein: 5.8 g/dL — ABNORMAL LOW (ref 6.5–8.1)

## 2022-01-28 LAB — C-REACTIVE PROTEIN: CRP: 14.1 mg/dL — ABNORMAL HIGH (ref <1.0)

## 2022-01-28 LAB — MAGNESIUM: Magnesium: 2 mg/dL (ref 1.7–2.4)

## 2022-01-28 LAB — BRAIN NATRIURETIC PEPTIDE: B Natriuretic Peptide: 34.6 pg/mL (ref 0.0–100.0)

## 2022-01-28 NOTE — Progress Notes (Signed)
PROGRESS NOTE                                                                                                                                                                                                             Patient Demographics:    Todd [name], is a 66 y.o. male, DOB - 09-26-1955, [id number]  Outpatient Primary MD for the patient is Charolette Forward, MD    LOS - 7  Admit date - 01/20/2022    Chief Complaint  Patient presents with   Fall       Brief Narrative (HPI from H&P)  66 year old male with past medical history of hyperlipidemia, hypothyroidism, hypertension and parkinsonism (follows with Dr. Krista Blue with Neurology)  who presents to [facility] emergency department via EMS status post fall.  Patient who lives with his sister claims that he mowed his lawn on Friday morning and became dehydrated and weak, subsequently he went inside his house and sustained a fall, according to the sister he was on the floor for 24 hours in fact she made up a bed for him on the floor so that he could sleep, about 12 hours into the fall he started getting little confused, he was subsequently brought to the ER as he was getting confused and was unable to get off the floor.  In the ER CT head and MRI brain were unremarkable, soon after he was seen by the admitting physician around 2 AM he went into what looks like alcohol withdrawal.  He was started on CIWA protocol.  Also his work-up subsequently came back positive for COVID-19 infection, he is currently admitted for dehydration, generalized weakness, metabolic encephalopathy, possible early DTs and COVID-19 infection.   Subjective:   Patient in bed, appears comfortable, denies any headache, no fever, no chest pain or pressure, no shortness of breath , no abdominal pain. No new focal weakness.   Assessment  & Plan :    Dehydration, mild COVID-19 infection, fall at home  with acute metabolic encephalopathy - some cute decline likely due to acute COVID-19 infection as well, does have mild inflammation as suggested by borderline D-dimer and CRP, chest x-ray stable and he is not hypoxic, continue to hydrate with IV fluids, trial of IV Decadron, PT OT.  Still extremely weak although eager to go home however reportedly he lives  with his sister and she is sick as well.  Thorough work-up including CT, CTA head and neck, MRI brain and EEG unremarkable, stable TSH, B12 and RPR.  At low-dose Seroquel twice daily started on 01/25/2022 dose adjusted on 01/26/2022 to assist with encephalopathy and agitation and monitor.  Still significantly weak physically requiring PT OT, may require SNF.  Social work requested to start working on discharge.  Case discussed with neurologist Dr. Su Monks on 01/26/2022 who reviewed the chart and recommended that she had nothing else to add except for outpatient [facility] neurology follow-up with Dr. Krista Blue.   SIRS (systemic inflammatory response syndrome) (HCC) - likely due to COVID-19 infection, stable after IV fluids continue to monitor.  Kindly see above.   Leukocytosis on 01/26/2022 at risk for aspiration and UA suggestive of UTI.  Febrile but remains at risk for aspiration due to encephalopathy, continue soft diet with feeding assistance aspiration precautions, speech input, short trial of Rocephin and Flagyl, chest x-ray appears stable for now.  Since D-dimer was borderline with acute COVID-19 infection will check lower extremity venous duplex as well.  Rhabdomyolysis- Patient presenting after lying down in his bedroom for over 24 hours with an markedly elevated creatinine kinase, due to fall and staying on the floor for 24 hours.  Hydrated with IV fluids.  Elevated troponin level not due myocardial infarction - Slightly elevated serial troponins with flat trajectory of elevation, Patient is chest pain-free, EKG nonacute continue to monitor  clinically.  Home dose aspirin, beta-blocker and statin resumed for secondary prevention.  Parkinsonism ([place]) - Follows with Dr. Krista Blue in the outpatient setting, Trial of Sinemet was recently discontinued earlier in the year due to being ineffective.  Monitor with supportive care.  Essential hypertension - placed on Coreg along with low-dose Catapres for DTs and as needed hydralazine.   Mixed hyperlipidemia - on 10 mg of Lipitor daily and fenofibrate 50 mg daily  Prolonged QT interval - keep potassium around 4 and magnesium around 2, Coreg and monitor.  Avoid QT prolonging medications.  CKD 3B.  Baseline creatinine appears to be close to 1.4.  Monitor.  History of alcohol use.  Denies any excessive use states he drinks 1 bottle of beer every other day, is little inconsistent but since he is more awake alert today I doubt he is in DTs and possibly truthful about his alcohol intake.  Continue to monitor.  Monitor on CIWA protocol administer Ativan only if he has agitation as he has resting tremors from Parkinson's.       Condition - Extremely Guarded  Family Communication  :  sister Asencion Partridge [phone] on 01/21/22, no response on the phone on 01/22/2022, 01/25/22.  Nephew and niece bedside on 01/27/2022.  Code Status :  Full  Consults  :  Case discussed with neurologist Dr. Su Monks on 01/26/2022 who reviewed the chart and recommended that she had nothing else to add except for outpatient [facility] neurology follow-up with Dr. Krista Blue.  PUD Prophylaxis : PPI   Procedures  :     EEG, CT head, CTA head and neck & MRI brain.  All nonacute.      Disposition Plan  :    Status is: Inpatient  DVT Prophylaxis  :    enoxaparin (LOVENOX) injection 40 mg Start: 01/21/22 0245   Lab Results  Component Value Date   PLT 135 (L) 01/27/2022    Diet :  Diet Order  DIET DYS 3 Room service appropriate? No; Fluid consistency: Nectar Thick  Diet effective now                     Inpatient Medications  Scheduled Meds:  amLODipine  5 mg Oral Daily   aspirin EC  81 mg Oral Daily   atorvastatin  10 mg Oral Daily   carvedilol  6.25 mg Oral Daily   enoxaparin (LOVENOX) injection  40 mg Subcutaneous Q24H   fenofibrate  54 mg Oral Daily   folic acid  1 mg Oral Daily   metroNIDAZOLE  500 mg Oral Q8H   multivitamin with minerals  1 tablet Oral Daily   omega-3 acid ethyl esters  1 g Oral Daily   pantoprazole  40 mg Oral Daily   thiamine  100 mg Oral Daily   Or   thiamine  100 mg Intravenous Daily   Continuous Infusions:  cefTRIAXone (ROCEPHIN)  IV 1 g (01/28/22 0532)    PRN Meds:.acetaminophen **OR** acetaminophen, albuterol, cloNIDine, haloperidol lactate, hydrALAZINE, menthol-cetylpyridinium, polyethylene glycol  Antibiotics  :    Anti-infectives (From admission, onward)    Start     Dose/Rate Route Frequency Ordered Stop   01/27/22 0645  cefTRIAXone (ROCEPHIN) 1 g in sodium chloride 0.9 % 100 mL IVPB        1 g 200 mL/hr over 30 Minutes Intravenous Every 24 hours 01/27/22 0549 02/01/22 0644   01/27/22 0645  metroNIDAZOLE (FLAGYL) tablet 500 mg        500 mg Oral Every 8 hours 01/27/22 0549 01/31/22 0559   01/26/22 1100  Ampicillin-Sulbactam (UNASYN) 3 g in sodium chloride 0.9 % 100 mL IVPB  Status:  Discontinued        3 g 200 mL/hr over 30 Minutes Intravenous Every 6 hours 01/26/22 1007 01/27/22 0549        Time Spent in minutes  30   Lala Lund M.D on 01/28/2022 at 10:28 AM  To page go to [email or web address]   [facility] -  Office  [phone]  See all Orders from today for further details    Objective:   Vitals:   01/27/22 2300 01/28/22 0516 01/28/22 1007 01/28/22 1011  BP: (!) 134/93 104/82 125/80 125/80  Pulse: 82 93 87 87  Resp:  18  18  Temp:  98.5 F (36.9 C)  98.5 F (36.9 C)  TempSrc:  Oral  Oral  SpO2:  97%  95%  Weight:      Height:        Wt Readings from Last 3 Encounters:  01/24/22 77 kg  07/13/21 86.2  kg  04/14/21 83 kg     Intake/Output Summary (Last 24 hours) at 01/28/2022 1028 Last data filed at 01/28/2022 0200 Gross per 24 hour  Intake 937.68 ml  Output 775 ml  Net 162.68 ml     Physical Exam  Awake remains confused, no focal deficits, able to respond to basic commands [place].AT,PERRAL Supple Neck, No JVD,   Symmetrical Chest wall movement, Good air movement bilaterally, CTAB RRR,No Gallops, Rubs or new Murmurs,  +ve B.Sounds, Abd Soft, No tenderness,   No Cyanosis, Clubbing or edema      Data Review:    CBC Recent Labs  Lab 01/23/22 0646 01/24/22 0248 01/25/22 0333 01/26/22 0522 01/27/22 0953  WBC 3.2* 6.0 9.7 16.7* 9.5  HGB 16.2 15.8 16.2 16.0 15.1  HCT 47.8 45.0 46.4 46.1 44.3  PLT 105* 94* 137* 128* 135*  MCV 97.2 94.9 94.9 95.4 97.1  MCH 32.9 33.3 33.1 33.1 33.1  MCHC 33.9 35.1 34.9 34.7 34.1  RDW 11.8 11.6 11.6 11.6 11.8  LYMPHSABS 0.5* 0.6* 0.9 0.9 0.4*  MONOABS 0.4 0.4 1.1* 1.2* 0.8  EOSABS 0.0 0.0 0.0 0.0 0.0  BASOSABS 0.0 0.0 0.0 0.0 0.0    Electrolytes Recent Labs  Lab 01/22/22 0631 01/23/22 0646 01/24/22 0248 01/24/22 0300 01/25/22 0333 01/26/22 0522 01/27/22 0953  NA 134* 136 138  --  140 138 139  K 4.3 4.9 4.6  --  3.8 3.4* 4.0  CL 102 105 104  --  105 101 105  CO2 21* 24 22  --  26 25 23   GLUCOSE 97 141* 129*  --  110* 101* 112*  BUN 12 16 20   --  22 22 15   CREATININE 1.02 1.10 1.06  --  1.17 1.28* 1.23  CALCIUM 8.4* 8.7* 8.7*  --  8.9 8.7* 8.4*  AST 72* 67* 58*  --  56* 65* 34  ALT 28 30 31   --  37 62* 44  ALKPHOS 43 48 46  --  51 51 62  BILITOT 1.9* 2.0* 2.4*  --  2.4* 2.5* 2.5*  ALBUMIN 3.0* 3.2* 3.4*  --  3.6 3.4* 3.0*  MG 2.1 2.1 2.1  --  2.1  --  1.9  CRP 7.8* 4.5* 1.7*  --  0.8 10.8* 15.6*  DDIMER 1.14* 0.79* 0.75*  --  0.59* 0.53*  --   PROCALCITON  --   --   --   --   --  0.36 0.46  BNP 44.3 54.1  --  73.0 59.6  --  53.2    No results for input(s): "TSH", "T4TOTAL", "T3FREE", "THYROIDAB" in the last 72  hours.  Invalid input(s): "FREET3"  Radiology Reports DG Chest Port 1 View  Result Date: 01/28/2022 CLINICAL DATA:  Shortness of breath. EXAM: PORTABLE CHEST 1 VIEW COMPARISON:  01/26/2022 FINDINGS: 0748 hours. Low volume film. The cardio pericardial silhouette is enlarged. New infrahilar opacity in the medial right lung base suggests pneumonia. The visualized bony structures of the thorax are unremarkable. IMPRESSION: New infrahilar opacity in the medial right lung base suggests pneumonia. Electronically Signed   By: Misty Stanley M.D.   On: 01/28/2022 08:06   DG Chest Port 1 View  Result Date: 01/26/2022 CLINICAL DATA:  Weakness, confusion and COVID positive. EXAM: PORTABLE CHEST 1 VIEW COMPARISON:  01/20/2022 FINDINGS: Lungs are adequately inflated without focal airspace consolidation or effusion. Cardiomediastinal silhouette and remainder of the exam is unchanged. IMPRESSION: No active disease. Electronically Signed   By: Marin Olp M.D.   On: 01/26/2022 08:04

## 2022-01-29 DIAGNOSIS — G9341 Metabolic encephalopathy: Secondary | ICD-10-CM | POA: Diagnosis not present

## 2022-01-29 LAB — URINE CULTURE: Culture: >=100000 — AB

## 2022-01-29 LAB — CBC WITH DIFFERENTIAL/PLATELET
Abs Immature Granulocytes: 0.07 10*3/uL (ref 0.00–0.07)
Basophils Absolute: 0 10*3/uL (ref 0.0–0.1)
Basophils Relative: 0 %
Eosinophils Absolute: 0 10*3/uL (ref 0.0–0.5)
Eosinophils Relative: 0 %
HCT: 46.3 % (ref 39.0–52.0)
Hemoglobin: 15.3 g/dL (ref 13.0–17.0)
Immature Granulocytes: 1 %
Lymphocytes Relative: 6 %
Lymphs Abs: 0.7 10*3/uL (ref 0.7–4.0)
MCH: 32.4 pg (ref 26.0–34.0)
MCHC: 33 g/dL (ref 30.0–36.0)
MCV: 98.1 fL (ref 80.0–100.0)
Monocytes Absolute: 1.9 10*3/uL — ABNORMAL HIGH (ref 0.1–1.0)
Monocytes Relative: 16 %
Neutro Abs: 9.4 10*3/uL — ABNORMAL HIGH (ref 1.7–7.7)
Neutrophils Relative %: 77 %
Platelets: 209 10*3/uL (ref 150–400)
RBC: 4.72 MIL/uL (ref 4.22–5.81)
RDW: 11.9 % (ref 11.5–15.5)
WBC: 12.2 10*3/uL — ABNORMAL HIGH (ref 4.0–10.5)
nRBC: 0 % (ref 0.0–0.2)

## 2022-01-29 LAB — BRAIN NATRIURETIC PEPTIDE: B Natriuretic Peptide: 22.4 pg/mL (ref 0.0–100.0)

## 2022-01-29 LAB — COMPREHENSIVE METABOLIC PANEL
ALT: 39 U/L (ref 0–44)
AST: 58 U/L — ABNORMAL HIGH (ref 15–41)
Albumin: 3 g/dL — ABNORMAL LOW (ref 3.5–5.0)
Alkaline Phosphatase: 58 U/L (ref 38–126)
Anion gap: 10 (ref 5–15)
BUN: 23 mg/dL (ref 8–23)
CO2: 27 mmol/L (ref 22–32)
Calcium: 9 mg/dL (ref 8.9–10.3)
Chloride: 100 mmol/L (ref 98–111)
Creatinine, Ser: 1.15 mg/dL (ref 0.61–1.24)
GFR, Estimated: >60 mL/min (ref >60)
Glucose, Bld: 117 mg/dL — ABNORMAL HIGH (ref 70–99)
Potassium: 3.9 mmol/L (ref 3.5–5.1)
Sodium: 137 mmol/L (ref 135–145)
Total Bilirubin: 2.2 mg/dL — ABNORMAL HIGH (ref 0.3–1.2)
Total Protein: 6.6 g/dL (ref 6.5–8.1)

## 2022-01-29 LAB — GLUCOSE, CAPILLARY: Glucose-Capillary: 154 mg/dL — ABNORMAL HIGH (ref 70–99)

## 2022-01-29 LAB — C-REACTIVE PROTEIN: CRP: 11.1 mg/dL — ABNORMAL HIGH (ref <1.0)

## 2022-01-29 LAB — MAGNESIUM: Magnesium: 2.2 mg/dL (ref 1.7–2.4)

## 2022-01-29 MED ORDER — LACTATED RINGERS IV BOLUS
1000.0000 mL | Freq: Once | INTRAVENOUS | Status: AC
  Administered 2022-01-29: 1000 mL via INTRAVENOUS

## 2022-01-29 MED ORDER — CARVEDILOL 3.125 MG PO TABS: 3.1250 mg | ORAL_TABLET | Freq: Every day | ORAL | Status: DC

## 2022-01-29 MED ORDER — LACTATED RINGERS IV SOLN: INTRAVENOUS | Status: AC

## 2022-01-29 NOTE — Progress Notes (Addendum)
PROGRESS NOTE                                                                                                                                                                                                             Patient Demographics:    Todd [name], is a 66 y.o. male, DOB - Sep 26, 1955, ITG:[id number]  Outpatient Primary MD for the patient is Rinaldo Cloud, MD    LOS - 8  Admit date - 01/20/2022    Chief Complaint  Patient presents with   Fall       Brief Narrative (HPI from H&P)  66 year old male with past medical history of hyperlipidemia, hypothyroidism, hypertension and parkinsonism (follows with Dr. Terrace Arabia with Neurology)  who presents to [facility] emergency department via EMS status post fall.  Patient who lives with his sister claims that he mowed his lawn on Friday morning and became dehydrated and weak, subsequently he went inside his house and sustained a fall, according to the sister he was on the floor for 24 hours in fact she made up a bed for him on the floor so that he could sleep, about 12 hours into the fall he started getting little confused, he was subsequently brought to the ER as he was getting confused and was unable to get off the floor.  In the ER CT head and MRI brain were unremarkable, soon after he was seen by the admitting physician around 2 AM he went into what looks like alcohol withdrawal.  He was started on CIWA protocol.  Also his work-up subsequently came back positive for COVID-19 infection, he is currently admitted for dehydration, generalized weakness, metabolic encephalopathy, possible early DTs and COVID-19 infection.   Subjective:   Patient in bed, appears comfortable, denies any headache, no fever, no chest pain or pressure, no shortness of breath , no abdominal pain. No focal weakness.   Assessment  & Plan :    Dehydration, mild COVID-19 infection, fall at home with  acute metabolic encephalopathy - some cute decline likely due to acute COVID-19 infection as well, does have mild inflammation as suggested by borderline D-dimer and CRP, chest x-ray stable and he is not hypoxic, continue to hydrate with IV fluids, trial of IV Decadron, PT OT.  Still extremely weak although eager to go home however reportedly he lives with  his sister and she is sick as well.  Thorough work-up including CT, CTA head and neck, MRI brain and EEG unremarkable, stable TSH, B12 and RPR.  At low-dose Seroquel twice daily started on 01/25/2022 dose adjusted on 01/26/2022 to assist with encephalopathy and agitation and monitor.  Still significantly weak physically requiring PT OT, may require SNF.  Social work requested to start working on discharge.  Case discussed with neurologist Dr. Bing Neighbors on 01/26/2022 who reviewed the chart and recommended that she had nothing else to add except for outpatient [facility] neurology follow-up with Dr. Terrace Arabia.  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  He will require SNF.  TOC has been informed   SIRS (systemic inflammatory response syndrome) (HCC) - likely due to COVID-19 infection, stable after IV fluids continue to monitor.  Kindly see above.   Aspiration pneumonia not present on admission along with Klebsiella UTI.  On empiric antibiotics.  Speech following.  Rhabdomyolysis- Patient presenting after lying down in his bedroom for over 24 hours with an markedly elevated creatinine kinase, due to fall and staying on the floor for 24 hours.  Hydrated with IV fluids.  Elevated troponin level not due myocardial infarction - Slightly elevated serial troponins with flat trajectory of elevation, Patient is chest pain-free, EKG nonacute continue to monitor clinically.  Home dose aspirin, beta-blocker and statin  resumed for secondary prevention.  Parkinsonism (HCC) - Follows with Dr. Terrace Arabia in the outpatient setting, Trial of Sinemet was recently discontinued earlier in the year due to being ineffective.  Monitor with supportive care.  Essential hypertension - on as needed hydralazine.  Addendum - 2 pm 01/29/22 - passed out standing up to use Commode, ++ Hypotensive, Stop all BP Meds, IVF, TEDs, tele Monitor.    Mixed hyperlipidemia - on 10 mg of Lipitor daily and fenofibrate 50 mg daily  Prolonged QT interval - keep potassium around 4 and magnesium around 2, Coreg and monitor.  Avoid QT prolonging medications.  CKD 3B.  Baseline creatinine appears to be close to 1.4.  Monitor.  History of alcohol use.  Denies any excessive use states he drinks 1 bottle of beer every other day, is little inconsistent but since he is more awake alert today I doubt he is in DTs and possibly truthful about his alcohol intake.  Continue to monitor.  Monitor on CIWA protocol administer Ativan only if he has agitation as he has resting tremors from Parkinson's.       Condition - Extremely Guarded  Family Communication  :  sister [name] [phone] on 01/21/22, no response on the phone on 01/22/2022, 01/25/22.  Nephew and niece bedside on 01/27/2022.  Code Status :  Full  Consults  :  Case discussed with neurologist Dr. Bing Neighbors on 01/26/2022 who reviewed the chart and recommended that she had nothing else to add except for outpatient [facility] neurology follow-up with Dr. Terrace Arabia.  PUD Prophylaxis : PPI   Procedures  :     EEG, CT head, CTA head and neck & MRI brain.  All nonacute.      Disposition Plan  :    Status is: Inpatient  DVT Prophylaxis  :    enoxaparin (LOVENOX) injection 40 mg Start: 01/21/22 0245   Lab Results  Component Value Date   PLT 159 01/28/2022    Diet :  Diet Order  DIET DYS 3 Room service appropriate? No; Fluid consistency: Nectar Thick  Diet effective now                     Inpatient Medications  Scheduled Meds:  amLODipine  5 mg Oral Daily   aspirin EC  81 mg Oral Daily   atorvastatin  10 mg Oral Daily   carvedilol  6.25 mg Oral Daily   enoxaparin (LOVENOX) injection  40 mg Subcutaneous Q24H   fenofibrate  54 mg Oral Daily   folic acid  1 mg Oral Daily   metroNIDAZOLE  500 mg Oral Q8H   multivitamin with minerals  1 tablet Oral Daily   omega-3 acid ethyl esters  1 g Oral Daily   pantoprazole  40 mg Oral Daily   thiamine  100 mg Oral Daily   Or   thiamine  100 mg Intravenous Daily   Continuous Infusions:  cefTRIAXone (ROCEPHIN)  IV 1 g (01/29/22 0604)    PRN Meds:.acetaminophen **OR** acetaminophen, albuterol, cloNIDine, haloperidol lactate, hydrALAZINE, menthol-cetylpyridinium, polyethylene glycol  Antibiotics  :    Anti-infectives (From admission, onward)    Start     Dose/Rate Route Frequency Ordered Stop   01/27/22 0645  cefTRIAXone (ROCEPHIN) 1 g in sodium chloride 0.9 % 100 mL IVPB        1 g 200 mL/hr over 30 Minutes Intravenous Every 24 hours 01/27/22 0549 02/01/22 0644   01/27/22 0645  metroNIDAZOLE (FLAGYL) tablet 500 mg        500 mg Oral Every 8 hours 01/27/22 0549 01/31/22 0559   01/26/22 1100  Ampicillin-Sulbactam (UNASYN) 3 g in sodium chloride 0.9 % 100 mL IVPB  Status:  Discontinued        3 g 200 mL/hr over 30 Minutes Intravenous Every 6 hours 01/26/22 1007 01/27/22 0549        Time Spent in minutes  30   Susa RaringPrashant Lenay Lovejoy M.D on 01/29/2022 at 9:09 AM  To page go to [email or web address]   [facility] -  Office  [phone]  See all Orders from today for further details    Objective:   Vitals:   01/28/22 1011 01/28/22 1724 01/28/22 2148 01/29/22 0606  BP: 125/80 (!) 126/93 (!) 116/91 (!) 130/95  Pulse: 87 94 96 82  Resp: 18 19 16 18   Temp: 98.5 F (36.9 C) 98.6 F (37 C) 99.9 F (37.7 C) 98.5 F (36.9 C)  TempSrc: Oral  Oral Oral  SpO2: 95% 96% 94% 99%  Weight:      Height:         Wt Readings from Last 3 Encounters:  01/24/22 77 kg  07/13/21 86.2 kg  04/14/21 83 kg     Intake/Output Summary (Last 24 hours) at 01/29/2022 0909 Last data filed at 01/29/2022 0837 Gross per 24 hour  Intake 480 ml  Output 725 ml  Net -245 ml     Physical Exam  Awake remains confused, no focal deficits, mild baseline tremors from Parkinson's, able to respond to basic commands [place].AT,PERRAL Supple Neck, No JVD,   Symmetrical Chest wall movement, Good air movement bilaterally, CTAB RRR,No Gallops, Rubs or new Murmurs,  +ve B.Sounds, Abd Soft, No tenderness,   No Cyanosis, Clubbing or edema    Data Review:    CBC Recent Labs  Lab 01/24/22 0248 01/25/22 0333 01/26/22 0522 01/27/22 0953 01/28/22 1104  WBC 6.0 9.7 16.7* 9.5 6.9  HGB 15.8 16.2 16.0 15.1 14.7  HCT 45.0 46.4  46.1 44.3 42.7  PLT 94* 137* 128* 135* 159  MCV 94.9 94.9 95.4 97.1 96.0  MCH 33.3 33.1 33.1 33.1 33.0  MCHC 35.1 34.9 34.7 34.1 34.4  RDW 11.6 11.6 11.6 11.8 11.9  LYMPHSABS 0.6* 0.9 0.9 0.4* 0.6*  MONOABS 0.4 1.1* 1.2* 0.8 1.4*  EOSABS 0.0 0.0 0.0 0.0 0.0  BASOSABS 0.0 0.0 0.0 0.0 0.0    Electrolytes Recent Labs  Lab 01/23/22 0646 01/24/22 0248 01/24/22 0300 01/25/22 0333 01/26/22 0522 01/27/22 0953 01/28/22 1104  NA 136 138  --  140 138 139 137  K 4.9 4.6  --  3.8 3.4* 4.0 3.6  CL 105 104  --  105 101 105 104  CO2 24 22  --  26 25 23 24   GLUCOSE 141* 129*  --  110* 101* 112* 121*  BUN 16 20  --  22 22 15 19   CREATININE 1.10 1.06  --  1.17 1.28* 1.23 1.24  CALCIUM 8.7* 8.7*  --  8.9 8.7* 8.4* 8.3*  AST 67* 58*  --  56* 65* 34 27  ALT 30 31  --  37 62* 44 31  ALKPHOS 48 46  --  51 51 62 51  BILITOT 2.0* 2.4*  --  2.4* 2.5* 2.5* 1.9*  ALBUMIN 3.2* 3.4*  --  3.6 3.4* 3.0* 2.8*  MG 2.1 2.1  --  2.1  --  1.9 2.0  CRP 4.5* 1.7*  --  0.8 10.8* 15.6* 14.1*  DDIMER 0.79* 0.75*  --  0.59* 0.53*  --   --   PROCALCITON  --   --   --   --  0.36 0.46 0.64  BNP 54.1  --  73.0 59.6  --   53.2 34.6    No results for input(s): "TSH", "T4TOTAL", "T3FREE", "THYROIDAB" in the last 72 hours.  Invalid input(s): "FREET3"  Radiology Reports VAS [place] LOWER EXTREMITY VENOUS (DVT)  Result Date: 01/28/2022  Lower Venous DVT Study Patient Name:  TRAMMELL BOWDEN  Date of Exam:   01/28/2022 Medical Rec #: [id number]          Accession #:    [id number] Date of Birth: 04-01-1956           Patient Gender: M Patient Age:   23 years Exam Location:  [facility] Procedure:      VAS [place] LOWER EXTREMITY VENOUS (DVT) Referring Phys: Deno [facility] --------------------------------------------------------------------------------  Indications: "Rapidly rising D-dimer" (D-dimer: 01/25/22 0.59 & 01/26/22 0.53). Other Indications: Covid+. Comparison Study: No previous exams Performing Technologist: Jody Hill RVT, RDMS  Examination Guidelines: A complete evaluation includes B-mode imaging, spectral Doppler, color Doppler, and power Doppler as needed of all accessible portions of each vessel. Bilateral testing is considered an integral part of a complete examination. Limited examinations for reoccurring indications may be performed as noted. The reflux portion of the exam is performed with the patient in reverse Trendelenburg.  +---------+---------------+---------+-----------+----------+--------------+ RIGHT    CompressibilityPhasicitySpontaneityPropertiesThrombus Aging +---------+---------------+---------+-----------+----------+--------------+ CFV      Full           Yes      Yes                                 +---------+---------------+---------+-----------+----------+--------------+ SFJ      Full                                                        +---------+---------------+---------+-----------+----------+--------------+  FV Prox  Full           Yes      Yes                                 +---------+---------------+---------+-----------+----------+--------------+ FV Mid   Full            Yes      Yes                                 +---------+---------------+---------+-----------+----------+--------------+ FV DistalFull           Yes      Yes                                 +---------+---------------+---------+-----------+----------+--------------+ PFV      Full                                                        +---------+---------------+---------+-----------+----------+--------------+ POP      Full           Yes      Yes                                 +---------+---------------+---------+-----------+----------+--------------+ PTV      Full                                                        +---------+---------------+---------+-----------+----------+--------------+ PERO     Full                                                        +---------+---------------+---------+-----------+----------+--------------+   +---------+---------------+---------+-----------+----------+--------------+ LEFT     CompressibilityPhasicitySpontaneityPropertiesThrombus Aging +---------+---------------+---------+-----------+----------+--------------+ CFV      Full           Yes      Yes                                 +---------+---------------+---------+-----------+----------+--------------+ SFJ      Full                                                        +---------+---------------+---------+-----------+----------+--------------+ FV Prox  Full           Yes      Yes                                 +---------+---------------+---------+-----------+----------+--------------+ FV Mid   Full  Yes      Yes                                 +---------+---------------+---------+-----------+----------+--------------+ FV DistalFull           Yes      Yes                                 +---------+---------------+---------+-----------+----------+--------------+ PFV      Full                                                         +---------+---------------+---------+-----------+----------+--------------+ POP      Full           Yes      Yes                                 +---------+---------------+---------+-----------+----------+--------------+ PTV      Full                                                        +---------+---------------+---------+-----------+----------+--------------+ PERO     Full                                                        +---------+---------------+---------+-----------+----------+--------------+    Summary: BILATERAL: - No evidence of deep vein thrombosis seen in the lower extremities, bilaterally. -No evidence of popliteal cyst, bilaterally.   *See table(s) above for measurements and observations.    Preliminary    DG Chest Port 1 View  Result Date: 01/28/2022 CLINICAL DATA:  Shortness of breath. EXAM: PORTABLE CHEST 1 VIEW COMPARISON:  01/26/2022 FINDINGS: 0748 hours. Low volume film. The cardio pericardial silhouette is enlarged. New infrahilar opacity in the medial right lung base suggests pneumonia. The visualized bony structures of the thorax are unremarkable. IMPRESSION: New infrahilar opacity in the medial right lung base suggests pneumonia. Electronically Signed   By: Kennith Center M.D.   On: 01/28/2022 08:06   DG Chest Port 1 View  Result Date: 01/26/2022 CLINICAL DATA:  Weakness, confusion and COVID positive. EXAM: PORTABLE CHEST 1 VIEW COMPARISON:  01/20/2022 FINDINGS: Lungs are adequately inflated without focal airspace consolidation or effusion. Cardiomediastinal silhouette and remainder of the exam is unchanged. IMPRESSION: No active disease. Electronically Signed   By: Elberta Fortis M.D.   On: 01/26/2022 08:04

## 2022-01-29 NOTE — Progress Notes (Signed)
Physical Therapy Treatment Patient Details Name: Todd [name] MRN: [id number] DOB: [birth year] Today's Date: 01/29/2022   History of Present Illness 66 y.o. male presents to [facility] hospital on 01/20/2022 s/p 2 falls, experiencing L hip pain, down on the floor for ~24 hours after second fall. + COVID 19 and possible alcohol withdrawal,on CIWA precautions. +confusion  Pt admitted for management of rhabdomyolysis, SIRS. PMH includes HLD, HTN, Parkinsonism.    PT Comments    On entry pt reports need to use bathroom. Pt agreeable to get up to [facility]. Pt requires maxAx2 for coming to EoB where he requires B UE support to maintain balance. With HHA maxAx2 pt able to come to standing, found to be incontinent of stool already but reports need to have further BM, with mod Ax2 pt able to take pivotal steps to [facility]. Pt noted to have continued BM, carrying on conversation with therapy staff. While preparing for pericare pt became unresponsive still breathing but no response to increased tactile and verbal stimulation. Pt ultimately requires total Ax2 for transfer back to bed. Emergency call button pushed and floor RN s and Rapid Response in room. PT continues to strongly recommend SNF level rehab, despite family request to take pt home. PT will continue to follow acutely.   Recommendations for follow up therapy are one component of a multi-disciplinary discharge planning process, led by the attending physician.  Recommendations may be updated based on patient status, additional functional criteria and insurance authorization.  Follow Up Recommendations  Skilled nursing-short term rehab (<3 hours/day) Can patient physically be transported by private vehicle: No   Assistance Recommended at Discharge Frequent or constant Supervision/Assistance  Patient can return home with the following Two people to help with walking and/or transfers;Two people to help with bathing/dressing/bathroom;Assistance with  cooking/housework;Assistance with feeding;Direct supervision/assist for medications management;Direct supervision/assist for financial management;Assist for transportation;Help with stairs or ramp for entrance   Equipment Recommendations  Hospital bed;Wheelchair (measurements PT);Wheelchair cushion (measurements PT);BSC/3in1       Precautions / Restrictions Precautions Precautions: Fall Precaution Comments: retropulsive Restrictions Weight Bearing Restrictions: No     Mobility  Bed Mobility Overal bed mobility: Needs Assistance Bed Mobility: Supine to Sit, Sit to Supine, Rolling     Supine to sit: Max assist, +2 for physical assistance, HOB elevated Sit to supine: Total assist   General bed mobility comments: with HoB elevated, provided single step commands to sequence coming to EoB for reaching across to bed rail, requires maxAx2 with pad to scoot hips to EoB, and modA for bringing L UE to foot board to hold himself upright, total assist to get back to supine after becoming unresponsive on BSC    Transfers Overall transfer level: Needs assistance Equipment used: 2 person hand held assist Transfers: Sit to/from Stand, Bed to chair/wheelchair/BSC Sit to Stand: +2 safety/equipment, Mod assist   Step pivot transfers: Mod assist, +2 physical assistance       General transfer comment: modAx2 for HHA to stand and step pivot to [facility], after becoming unresponsive sitting on BSC, requires total A for squat pivot back to bed      Balance Overall balance assessment: Needs assistance Sitting-balance support: Feet supported, Single extremity supported, Bilateral upper extremity supported Sitting balance-Leahy Scale: Poor Sitting balance - Comments: requires hands on bedrail and footboard to maintain balance EoB   Standing balance support: Bilateral upper extremity supported, Single extremity supported Standing balance-Leahy Scale: Poor Standing balance comment: requires min-modA for  maintaining balance in standing  Cognition Arousal/Alertness: Awake/alert Behavior During Therapy: Flat affect Overall Cognitive Status: No family/caregiver present to determine baseline cognitive functioning Area of Impairment: Orientation, Memory, Following commands, Safety/judgement, Awareness                 Orientation Level: Disoriented to, Time, Situation   Memory: Decreased short-term memory Following Commands: Follows one step commands inconsistently, Follows one step commands with increased time Safety/Judgement: Decreased awareness of safety, Decreased awareness of deficits Awareness: Intellectual   General Comments: pt with increased difficulty with sequencing and command follow           General Comments General comments (skin integrity, edema, etc.): pt carrying on conversation while using BSC, has liquid BM and reports he is finished, carrying on conversation with therapist. while setting up for pericare pt became unresponsive, breathing but not responding to increasing verbal and tactile stimulation, activated staff emergency call and pt required total A for PT/Rehab Tech to transfer back to bed, Floor RNs and Rapid Response in room at end of session      Pertinent Vitals/Pain Pain Assessment Pain Assessment: No/denies pain Faces Pain Scale: No hurt     PT Goals (current goals can now be found in the care plan section) Acute Rehab PT Goals Patient Stated Goal: to go home PT Goal Formulation: With patient Time For Goal Achievement: 02/04/22 Potential to Achieve Goals: Fair Progress towards PT goals: Progressing toward goals (limited by unresponsive episode on BSC)    Frequency    Min 3X/week      PT Plan Discharge plan needs to be updated       AM-PAC PT "6 Clicks" Mobility   Outcome Measure  Help needed turning from your back to your side while in a flat bed without using bedrails?: A Lot Help needed  moving from lying on your back to sitting on the side of a flat bed without using bedrails?: Total Help needed moving to and from a bed to a chair (including a wheelchair)?: Total Help needed standing up from a chair using your arms (e.g., wheelchair or bedside chair)?: Total Help needed to walk in hospital room?: Total Help needed climbing 3-5 steps with a railing? : Total 6 Click Score: 7    End of Session Equipment Utilized During Treatment: Gait belt Activity Tolerance: Treatment limited secondary to medical complications (Comment) (unresponsive episode) Patient left: in bed;Other (comment) (floor RN and Rapid Response team in room) Nurse Communication: Other (comment) (unresponsive on BSC) PT Visit Diagnosis: Other abnormalities of gait and mobility (R26.89);Muscle weakness (generalized) (M62.81);Other symptoms and signs involving the nervous system (M38.466)     Time: 5993-5701 PT Time Calculation (min) (ACUTE ONLY): 32 min  Charges:  $Therapeutic Activity: 23-37 mins                     Charlsey Moragne B. Beverely Risen PT, DPT Acute Rehabilitation Services Please use secure chat or  Call Office [phone]    Elon Alas [facility] 01/29/2022, 2:34 PM

## 2022-01-29 NOTE — Progress Notes (Incomplete)
PT was agitated,confused and had a fall with RN at bedside,IM haldol given,pt still confused,

## 2022-01-29 NOTE — Significant Event (Addendum)
Rapid Response Event Note   Reason for Call :  Code blue called for pt being unresponsive. RN had just left patient's room where he was AO. PT went in to help him get to the [facility] for a bowel movement. Upon getting him up, he became unresponsive.  Initial Focused Assessment:  Pt minimally responsive upon code team arrival. He would grimace and briefy interact with staff following noxious stimuli. He would then become somnolent, bradypneic, with minimal respiratory effort.   Lungs sounds clear. Skin cool, pale, moist.   VS: T 98.51F, BP 105/78, HR 66, RR 6, SpO2 89% on room air CBG: 159  Interventions:  -Wean/titrate nasal cannula for SpO2 > 92%, or otherwise specified by MD  Plan of Care:  -Transfer to PCU -1L LR bolus  -Bedrest  Call rapid response for additional needs  Event Summary:  MD Notified: Dr. Candiss Norse Call Time: [place] Time: 0962 End Time: [place], RN

## 2022-01-29 NOTE — TOC Progression Note (Addendum)
Transition of Care [facility]) - Initial/Assessment Note    Patient Details  Name: Todd [name] MRN: [id number] Date of Birth: 26-Dec-1955  Transition of Care [facility]) CM/SW Contact:    Milinda Antis, [place] Phone Number: 01/29/2022, 10:34 AM  Clinical Narrative:                 CSW received message from patient's niece on 9/16 that the family is wanting to take patient home.  CSW followed up with family after reading MD note from today stating that family wanted SNF.  10:30-The niece reports that the family is in agreement with the patient going home and receiving home health.   The niece states that the patient's nephew will live in the home with the patient for 3 weeks to assist.  The family is requesting information about equipment such as a hospital bed and chair.  RNCM informed.   Expected Discharge Plan: [place] Barriers to Discharge: Continued Medical Work up   Patient Goals and CMS Choice Patient states their goals for this hospitalization and ongoing recovery are:: To return home CMS Medicare.gov Compare Post Acute Care list provided to:: Patient Choice offered to / list presented to : Patient  Expected Discharge Plan and Services Expected Discharge Plan: [place]   Discharge Planning Services: CM Consult Post Acute Care Choice: [place] arrangements for the past 2 months: Single Family Home                 DME Arranged: N/A DME Agency: NA       HH Arranged: PT, OT HH Agency: [place] Date [place]: 01/23/22 Time [place]: 1325 Representative spoke with at [place]: Claiborne Billings  Prior Living Arrangements/Services Living arrangements for the past 2 months: [place]   Patient language and need for interpreter reviewed:: Yes Do you feel safe going back to the place where you live?: Yes      Need for Family Participation in Patient Care: Yes (Comment) Care giver support system in  place?: Yes (comment)   Criminal Activity/Legal Involvement Pertinent to Current Situation/Hospitalization: No - Comment as needed  Activities of Daily Living      Permission Sought/Granted Permission sought to share information with : Case Manager, Customer service manager, Family Supports Permission granted to share information with : Yes, Verbal Permission Granted              Emotional Assessment Appearance:: Appears stated age Attitude/Demeanor/Rapport: Engaged, Gracious Affect (typically observed): Accepting, Appropriate, Calm, Hopeful, Pleasant Orientation: : Oriented to Self Alcohol / Substance Use: Illicit Drugs (THC) Psych Involvement: No (comment)  Admission diagnosis:  Acute delirium [R41.0] Weakness [R53.1] Fall, initial encounter [W19.XXXA] Unable to ambulate [R26.2] Traumatic rhabdomyolysis, initial encounter ([place]) [T79.6XXA] Patient Active Problem List   Diagnosis Date Noted   Elevated troponin level not due myocardial infarction 01/21/2022   Rhabdomyolysis 28/31/5176   Acute metabolic encephalopathy 16/11/3708   Fall at home, initial encounter 01/21/2022   SIRS (systemic inflammatory response syndrome) ([place]) 01/21/2022   Mixed hyperlipidemia 01/21/2022   Essential hypertension 01/21/2022   Prolonged QT interval 01/21/2022   Weakness 01/21/2022   Generalized weakness 01/20/2022   Parkinsonism ([place]) 04/14/2021   PCP:  Charolette Forward, MD Pharmacy:   [facility] [address], [place] - [address] [place] [address] [place] [address] Phone: [phone] Fax: [phone]     Social Determinants of Health (SDOH) Interventions  Readmission Risk Interventions     No data to display

## 2022-01-29 NOTE — Progress Notes (Signed)
Speech Language Pathology Treatment: Dysphagia  Patient Details Name: Todd [name] MRN: [id number] DOB: [birth year] Today's Date: 01/29/2022 Time: 5701-7793 SLP Time Calculation (min) (ACUTE ONLY): 15 min  Assessment / Plan / Recommendation Clinical Impression  Pt seen for dysphagia therapy with confusion and hallucinations noted. Mitt removed to assist with cup holding of trial of thin liquid. Pt coughing at baseline prior to po's after repositioned. He did not demonstrate immediate s/sx aspiration and had mild delayed cough. Mastication of Dys 3 texture was timely without residue. ST will upgrade liquids to thin, continue Dys 3 and ST will continue to follow for safety and efficiency of recommendations or need for instrumental evaluation.    HPI HPI: Pt is a 66 year old male who presented to the ED after fall. MRI brain negative. Alcohol withdrawl suspected by admitting MD and CIWA protocol initiated. Pt admitted for dehydration, generalized weakness, metabolic encephalopathy, possible early DTs and COVID-19 infection. Leukocytosis noted 9/15 and soft diet initiated by MD. PMH: hyperlipidemia, hypothyroidism, hypertension and parkinsonism (follows with Dr. Krista Blue with Neurology).      SLP Plan  Continue with current plan of care      Recommendations for follow up therapy are one component of a multi-disciplinary discharge planning process, led by the attending physician.  Recommendations may be updated based on patient status, additional functional criteria and insurance authorization.    Recommendations  Diet recommendations: Dysphagia 3 (mechanical soft);Thin liquid Liquids provided via: Cup;No straw Medication Administration: Whole meds with puree Supervision: Staff to assist with self feeding Compensations: Slow rate;Small sips/bites Postural Changes and/or Swallow Maneuvers: Seated upright 90 degrees                Oral Care Recommendations: Oral care BID Follow Up  Recommendations:  (TBD) Assistance recommended at discharge: Frequent or constant Supervision/Assistance SLP Visit Diagnosis: Dysphagia, unspecified (R13.10) Plan: Continue with current plan of care           Houston Siren  01/29/2022, 9:55 AM

## 2022-01-29 NOTE — Progress Notes (Signed)
Upon entering room around 1400 d/t therapist hitting patient room alarm patient noted to be in bed unresponsive assessment completed sternum rub performed pt yelled ouch vitals done WNL placed on 2/ via n/c pt noted to be pale in color after getting oxygen noted color to return pt talking at baseline noted to have large BM complete bed change completed peri care done MD and code team at bedside notified Md of findings order for LR bolus and then cont fluids TED hose patient resting with eyes closed bed in lowest position freq monitoring

## 2022-01-30 ENCOUNTER — Inpatient Hospital Stay (HOSPITAL_COMMUNITY): Payer: PPO

## 2022-01-30 DIAGNOSIS — G9341 Metabolic encephalopathy: Secondary | ICD-10-CM | POA: Diagnosis not present

## 2022-01-30 LAB — COMPREHENSIVE METABOLIC PANEL
ALT: 96 U/L — ABNORMAL HIGH (ref 0–44)
AST: 178 U/L — ABNORMAL HIGH (ref 15–41)
Albumin: 2.6 g/dL — ABNORMAL LOW (ref 3.5–5.0)
Alkaline Phosphatase: 52 U/L (ref 38–126)
Anion gap: 10 (ref 5–15)
BUN: 21 mg/dL (ref 8–23)
CO2: 22 mmol/L (ref 22–32)
Calcium: 8.2 mg/dL — ABNORMAL LOW (ref 8.9–10.3)
Chloride: 105 mmol/L (ref 98–111)
Creatinine, Ser: 1.14 mg/dL (ref 0.61–1.24)
GFR, Estimated: >60 mL/min (ref >60)
Glucose, Bld: 105 mg/dL — ABNORMAL HIGH (ref 70–99)
Potassium: 3.7 mmol/L (ref 3.5–5.1)
Sodium: 137 mmol/L (ref 135–145)
Total Bilirubin: 2.3 mg/dL — ABNORMAL HIGH (ref 0.3–1.2)
Total Protein: 5.7 g/dL — ABNORMAL LOW (ref 6.5–8.1)

## 2022-01-30 LAB — CBC WITH DIFFERENTIAL/PLATELET
Abs Immature Granulocytes: 0.09 10*3/uL — ABNORMAL HIGH (ref 0.00–0.07)
Basophils Absolute: 0 10*3/uL (ref 0.0–0.1)
Basophils Relative: 0 %
Eosinophils Absolute: 0 10*3/uL (ref 0.0–0.5)
Eosinophils Relative: 0 %
HCT: 40.3 % (ref 39.0–52.0)
Hemoglobin: 13.6 g/dL (ref 13.0–17.0)
Immature Granulocytes: 1 %
Lymphocytes Relative: 6 %
Lymphs Abs: 0.9 10*3/uL (ref 0.7–4.0)
MCH: 32.7 pg (ref 26.0–34.0)
MCHC: 33.7 g/dL (ref 30.0–36.0)
MCV: 96.9 fL (ref 80.0–100.0)
Monocytes Absolute: 1.7 10*3/uL — ABNORMAL HIGH (ref 0.1–1.0)
Monocytes Relative: 12 %
Neutro Abs: 11.4 10*3/uL — ABNORMAL HIGH (ref 1.7–7.7)
Neutrophils Relative %: 81 %
Platelets: 201 10*3/uL (ref 150–400)
RBC: 4.16 MIL/uL — ABNORMAL LOW (ref 4.22–5.81)
RDW: 12 % (ref 11.5–15.5)
WBC: 14.1 10*3/uL — ABNORMAL HIGH (ref 4.0–10.5)
nRBC: 0 % (ref 0.0–0.2)

## 2022-01-30 LAB — BRAIN NATRIURETIC PEPTIDE: B Natriuretic Peptide: 27.6 pg/mL (ref 0.0–100.0)

## 2022-01-30 LAB — MAGNESIUM: Magnesium: 2 mg/dL (ref 1.7–2.4)

## 2022-01-30 LAB — C-REACTIVE PROTEIN: CRP: 11.1 mg/dL — ABNORMAL HIGH (ref <1.0)

## 2022-01-30 MED ORDER — HALOPERIDOL LACTATE 5 MG/ML IJ SOLN
1.0000 mg | Freq: Once | INTRAMUSCULAR | Status: AC
  Administered 2022-01-30: 1 mg via INTRAVENOUS
  Filled 2022-01-30: qty 1

## 2022-01-30 MED ORDER — KCL IN DEXTROSE-NACL 20-5-0.9 MEQ/L-%-% IV SOLN
INTRAVENOUS | Status: AC
  Filled 2022-01-30 (×2): qty 1000

## 2022-01-30 MED ORDER — KCL IN DEXTROSE-NACL 20-5-0.9 MEQ/L-%-% IV SOLN
INTRAVENOUS | Status: DC
  Filled 2022-01-30: qty 1000

## 2022-01-30 MED ORDER — HALOPERIDOL LACTATE 5 MG/ML IJ SOLN
5.0000 mg | Freq: Four times a day (QID) | INTRAMUSCULAR | Status: DC | PRN
  Administered 2022-01-30 – 2022-01-31 (×2): 5 mg via INTRAMUSCULAR
  Filled 2022-01-30 (×4): qty 1

## 2022-01-30 NOTE — Progress Notes (Signed)
PROGRESS NOTE                                                                                                                                                                                                             Patient Demographics:    Todd [name], is a 66 y.o. male, DOB - 05/21/1955, XBM:[id number]  Outpatient Primary MD for the patient is Rinaldo CloudHarwani, Mohan, MD    LOS - 9  Admit date - 01/20/2022    Chief Complaint  Patient presents with   Fall       Brief Narrative (HPI from H&P)  66 year old male with past medical history of hyperlipidemia, hypothyroidism, hypertension and parkinsonism (follows with Dr. Terrace ArabiaYan with Neurology)  who [facility] department via EMS status post fall.  Patient who lives with his sister claims that he mowed his lawn on Friday morning and became dehydrated and weak, subsequently he went inside his house and sustained a fall, according to the sister he was on the floor for 24 hours in fact she made up a bed for him on the floor so that he could sleep, about 12 hours into the fall he started getting little confused, he was subsequently brought to the ER as he was getting confused and was unable to get off the floor.  In the ER CT head and MRI brain were unremarkable, soon after he was seen by the admitting physician around 2 AM he went into what looks like alcohol withdrawal.  He was started on CIWA protocol.  Also his work-up subsequently came back positive for COVID-19 infection, he is currently admitted for dehydration, generalized weakness, metabolic encephalopathy, possible early DTs and COVID-19 infection.   Subjective:   Patient in bed confused denies any headache chest pain, no abdominal pain at all.  No shortness of breath but quite confused.   Assessment  & Plan :    Dehydration, mild COVID-19 infection, fall at home with acute metabolic encephalopathy -  some cute decline likely due to acute COVID-19 infection as well, does have mild inflammation as suggested by borderline D-dimer and CRP, chest x-ray stable and he is not hypoxic, continue to hydrate with IV fluids, trial of IV Decadron, PT OT.  Still extremely weak although eager to go home however reportedly he lives with his sister and she  is sick as well.  Thorough work-up including CT, CTA head and neck, MRI brain and EEG unremarkable, stable TSH, B12 and RPR.  At low-dose Seroquel twice daily started on 01/25/2022 dose adjusted on 01/26/2022 to assist with encephalopathy and agitation and monitor.  Still significantly weak physically requiring PT OT, may require SNF.  Social work requested to start working on discharge.  Case discussed with neurologist Dr. Bing Neighbors on 01/26/2022 who reviewed the chart and recommended that she had nothing else to add except for outpatient [facility] neurology follow-up with Dr. Terrace Arabia.  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  He will require SNF.  TOC has been informed   SIRS (systemic inflammatory response syndrome) (HCC) - likely due to COVID-19 infection, stable after IV fluids continue to monitor.  Kindly see above.   Aspiration pneumonia not present on admission along with Klebsiella UTI.  On empiric antibiotics x 5 days.  Speech following.  Continue to monitor remains at risk for aspiration.  Rhabdomyolysis- Patient presenting after lying down in his bedroom for over 24 hours with an markedly elevated creatinine kinase, due to fall and staying on the floor for 24 hours.  Hydrated with IV fluids.  Elevated troponin level not due myocardial infarction - Slightly elevated serial troponins with flat trajectory of elevation, Patient is chest pain-free, EKG nonacute continue to monitor clinically.  Home dose  aspirin, beta-blocker and statin resumed for secondary prevention.  Parkinsonism (HCC) - Follows with Dr. Terrace Arabia in the outpatient setting, Trial of Sinemet was recently discontinued earlier in the year due to being ineffective.  Monitor with supportive care.  Essential hypertension - hypotensive, all blood admission medications held, syncopized due to hypotension while standing up on 01/29/2022, hydrate with IV fluids and monitor.   Mixed hyperlipidemia - on 10 mg of Lipitor daily and fenofibrate 50 mg daily  Mild asymptomatic transaminitis.  Worse due to bout of hypotension on 01/29/2022, no abdominal pain or tenderness, monitor if trend continues to get worse then right upper quadrant ultrasound.    Prolonged QT interval - keep potassium around 4 and magnesium around 2, Coreg and monitor.  Avoid QT prolonging medications.  CKD 3B.  Baseline creatinine appears to be close to 1.4.  Monitor.  History of alcohol use.  Denies any excessive use states he drinks 1 bottle of beer every other day, is little inconsistent but since he is more awake alert today I doubt he is in DTs and possibly truthful about his alcohol intake.  Continue to monitor.  Monitor on CIWA protocol administer Ativan only if he has agitation as he has resting tremors from Parkinson's.       Condition - Extremely Guarded  Family Communication  :    Myrtis Hopping [phone] on 01/21/22, no response on the phone on 01/22/2022, 01/25/22.  Detailed message left on 01/30/2022.  Nephew and niece bedside on 01/27/2022.  Called niece [name] [phone] uptodate on 01/30/2022   Code Status :  Full  Consults  :  Case discussed with neurologist Dr. Bing Neighbors on 01/26/2022 who reviewed the chart and recommended that she had nothing else to add except for outpatient [facility] neurology follow-up with Dr. Terrace Arabia.  PUD Prophylaxis : PPI   Procedures  :     EEG, CT head, CTA head and neck & MRI brain.  All nonacute.  Disposition Plan  :    Status is: Inpatient  DVT Prophylaxis  :    Place TED hose Start: 01/29/22 1419 enoxaparin (LOVENOX) injection 40 mg Start: 01/21/22 0245   Lab Results  Component Value Date   PLT 201 01/30/2022    Diet :  Diet Order             DIET DYS 3 Room service appropriate? Yes; Fluid consistency: Thin  Diet effective now                    Inpatient Medications  Scheduled Meds:  aspirin EC  81 mg Oral Daily   atorvastatin  10 mg Oral Daily   enoxaparin (LOVENOX) injection  40 mg Subcutaneous Q24H   fenofibrate  54 mg Oral Daily   folic acid  1 mg Oral Daily   metroNIDAZOLE  500 mg Oral Q8H   multivitamin with minerals  1 tablet Oral Daily   omega-3 acid ethyl esters  1 g Oral Daily   pantoprazole  40 mg Oral Daily   thiamine  100 mg Oral Daily   Continuous Infusions:  cefTRIAXone (ROCEPHIN)  IV 1 g (01/30/22 0642)   dextrose 5 % and 0.9 % NaCl with KCl 20 mEq/L 100 mL/hr at 01/30/22 1014   lactated ringers 75 mL/hr at 01/30/22 0137    PRN Meds:.acetaminophen **OR** acetaminophen, albuterol, cloNIDine, haloperidol lactate, hydrALAZINE, menthol-cetylpyridinium, polyethylene glycol  Antibiotics  :    Anti-infectives (From admission, onward)    Start     Dose/Rate Route Frequency Ordered Stop   01/27/22 0645  cefTRIAXone (ROCEPHIN) 1 g in sodium chloride 0.9 % 100 mL IVPB        1 g 200 mL/hr over 30 Minutes Intravenous Every 24 hours 01/27/22 0549 02/01/22 0644   01/27/22 0645  metroNIDAZOLE (FLAGYL) tablet 500 mg        500 mg Oral Every 8 hours 01/27/22 0549 02/01/22 0559   01/26/22 1100  Ampicillin-Sulbactam (UNASYN) 3 g in sodium chloride 0.9 % 100 mL IVPB  Status:  Discontinued        3 g 200 mL/hr over 30 Minutes Intravenous Every 6 hours 01/26/22 1007 01/27/22 0549        Time Spent in minutes  30   Lala Lund M.D on 01/30/2022 at 10:35 AM  To page go to [email or web address]   [facility] -  Office   [phone]  See all Orders from today for further details    Objective:   Vitals:   01/30/22 0135 01/30/22 0311 01/30/22 0437 01/30/22 0800  BP:      Pulse:      Resp:      Temp: 98.9 F (37.2 C) 97.8 F (36.6 C) 97.7 F (36.5 C) 98.8 F (37.1 C)  TempSrc: Oral Oral Oral Axillary  SpO2:      Weight:      Height:        Wt Readings from Last 3 Encounters:  01/24/22 77 kg  07/13/21 86.2 kg  04/14/21 83 kg     Intake/Output Summary (Last 24 hours) at 01/30/2022 1035 Last data filed at 01/29/2022 1600 Gross per 24 hour  Intake 90 ml  Output 750 ml  Net -660 ml     Physical Exam  Awake remains confused, no focal deficits, mild baseline tremors from Parkinson's, able to respond to basic commands [place].AT,PERRAL Supple Neck, No JVD,   Symmetrical Chest wall movement, Good air movement bilaterally, CTAB RRR,No  Gallops, Rubs or new Murmurs,  +ve B.Sounds, Abd Soft, No tenderness,   No Cyanosis, Clubbing or edema     Data Review:    CBC Recent Labs  Lab 01/26/22 0522 01/27/22 0953 01/28/22 1104 01/29/22 0809 01/30/22 0808  WBC 16.7* 9.5 6.9 12.2* 14.1*  HGB 16.0 15.1 14.7 15.3 13.6  HCT 46.1 44.3 42.7 46.3 40.3  PLT 128* 135* 159 209 201  MCV 95.4 97.1 96.0 98.1 96.9  MCH 33.1 33.1 33.0 32.4 32.7  MCHC 34.7 34.1 34.4 33.0 33.7  RDW 11.6 11.8 11.9 11.9 12.0  LYMPHSABS 0.9 0.4* 0.6* 0.7 0.9  MONOABS 1.2* 0.8 1.4* 1.9* 1.7*  EOSABS 0.0 0.0 0.0 0.0 0.0  BASOSABS 0.0 0.0 0.0 0.0 0.0    Electrolytes Recent Labs  Lab 01/24/22 0248 01/24/22 0300 01/25/22 0333 01/26/22 0522 01/27/22 0953 01/28/22 1104 01/29/22 0809 01/29/22 1020 01/30/22 0808  NA 138  --  140 138 139 137 137  --  137  K 4.6  --  3.8 3.4* 4.0 3.6 3.9  --  3.7  CL 104  --  105 101 105 104 100  --  105  CO2 22  --  26 25 23 24 27   --  22  GLUCOSE 129*  --  110* 101* 112* 121* 117*  --  105*  BUN 20  --  22 22 15 19 23   --  21  CREATININE 1.06  --  1.17 1.28* 1.23 1.24 1.15  --  1.14   CALCIUM 8.7*  --  8.9 8.7* 8.4* 8.3* 9.0  --  8.2*  AST 58*  --  56* 65* 34 27 58*  --  178*  ALT 31  --  37 62* 44 31 39  --  96*  ALKPHOS 46  --  51 51 62 51 58  --  52  BILITOT 2.4*  --  2.4* 2.5* 2.5* 1.9* 2.2*  --  2.3*  ALBUMIN 3.4*  --  3.6 3.4* 3.0* 2.8* 3.0*  --  2.6*  MG 2.1  --  2.1  --  1.9 2.0 2.2  --  2.0  CRP 1.7*  --  0.8 10.8* 15.6* 14.1* 11.1*  --  11.1*  DDIMER 0.75*  --  0.59* 0.53*  --   --   --   --   --   PROCALCITON  --   --   --  0.36 0.46 0.64  --   --   --   BNP  --    < > 59.6  --  53.2 34.6  --  22.4 27.6   < > = values in this interval not displayed.    No results for input(s): "TSH", "T4TOTAL", "T3FREE", "THYROIDAB" in the last 72 hours.  Invalid input(s): "FREET3"  Radiology Reports VAS LOWER EXTREMITY VENOUS (DVT)  Result Date: 01/29/2022  Lower Venous DVT Study Patient Name:  BRODEY BONN  Date of Exam:   01/28/2022 Medical Rec #: Todd Czar          Accession #:    01/30/2022 Date of Birth: October 28, 1955           Patient Gender: M Patient Age:   66 years Exam Location:  [facility] Procedure:      VAS 73 LOWER EXTREMITY VENOUS (DVT) Referring Phys: [facility] --------------------------------------------------------------------------------  Indications: "Rapidly rising D-dimer" (D-dimer: 01/25/22 0.59 & 01/26/22 0.53). Other Indications: Covid+. Comparison Study: No previous exams Performing Technologist: Jody Hill RVT, RDMS  Examination  Guidelines: A complete evaluation includes B-mode imaging, spectral Doppler, color Doppler, and power Doppler as needed of all accessible portions of each vessel. Bilateral testing is considered an integral part of a complete examination. Limited examinations for reoccurring indications may be performed as noted. The reflux portion of the exam is performed with the patient in reverse Trendelenburg.  +---------+---------------+---------+-----------+----------+--------------+ RIGHT     CompressibilityPhasicitySpontaneityPropertiesThrombus Aging +---------+---------------+---------+-----------+----------+--------------+ CFV      Full           Yes      Yes                                 +---------+---------------+---------+-----------+----------+--------------+ SFJ      Full                                                        +---------+---------------+---------+-----------+----------+--------------+ FV Prox  Full           Yes      Yes                                 +---------+---------------+---------+-----------+----------+--------------+ FV Mid   Full           Yes      Yes                                 +---------+---------------+---------+-----------+----------+--------------+ FV DistalFull           Yes      Yes                                 +---------+---------------+---------+-----------+----------+--------------+ PFV      Full                                                        +---------+---------------+---------+-----------+----------+--------------+ POP      Full           Yes      Yes                                 +---------+---------------+---------+-----------+----------+--------------+ PTV      Full                                                        +---------+---------------+---------+-----------+----------+--------------+ PERO     Full                                                        +---------+---------------+---------+-----------+----------+--------------+   +---------+---------------+---------+-----------+----------+--------------+ LEFT  CompressibilityPhasicitySpontaneityPropertiesThrombus Aging +---------+---------------+---------+-----------+----------+--------------+ CFV      Full           Yes      Yes                                 +---------+---------------+---------+-----------+----------+--------------+ SFJ      Full                                                         +---------+---------------+---------+-----------+----------+--------------+ FV Prox  Full           Yes      Yes                                 +---------+---------------+---------+-----------+----------+--------------+ FV Mid   Full           Yes      Yes                                 +---------+---------------+---------+-----------+----------+--------------+ FV DistalFull           Yes      Yes                                 +---------+---------------+---------+-----------+----------+--------------+ PFV      Full                                                        +---------+---------------+---------+-----------+----------+--------------+ POP      Full           Yes      Yes                                 +---------+---------------+---------+-----------+----------+--------------+ PTV      Full                                                        +---------+---------------+---------+-----------+----------+--------------+ PERO     Full                                                        +---------+---------------+---------+-----------+----------+--------------+     Summary: BILATERAL: - No evidence of deep vein thrombosis seen in the lower extremities, bilaterally. -No evidence of popliteal cyst, bilaterally.   *See table(s) above for measurements and observations. Electronically signed by Sherald Hess MD on 01/29/2022 at 2:55:42 PM.    Final    DG Chest Port 1 View  Result Date: 01/28/2022 CLINICAL DATA:  Shortness of breath. EXAM: PORTABLE CHEST  1 VIEW COMPARISON:  01/26/2022 FINDINGS: 0748 hours. Low volume film. The cardio pericardial silhouette is enlarged. New infrahilar opacity in the medial right lung base suggests pneumonia. The visualized bony structures of the thorax are unremarkable. IMPRESSION: New infrahilar opacity in the medial right lung base suggests pneumonia. Electronically Signed   By: Kennith Center M.D.    On: 01/28/2022 08:06

## 2022-01-30 NOTE — Progress Notes (Signed)
Pt is still agitated confused,refusing his meds,pulling off his leads,has a tremor in his hand,Dr.kakrakandy was informed,Pt seen by DR ,ordered haldol 1mg  iv.

## 2022-01-30 NOTE — Progress Notes (Signed)
   01/29/22 1930  What Happened  Was fall witnessed? Yes  Who witnessed fall? Larina Bras, RN  Patients activity before fall to/from bed, chair, or stretcher  Point of contact hip/leg  Was patient injured? No  Stated prior activity to/from bed, chair, or stretcher  Follow Up  MD notified Dr. Glenis Smoker  Time MD notified 2000  Family notified No - patient refusal  Progress note created (see row info) Yes  Adult Fall Risk Assessment  Risk Factor Category (scoring not indicated) Fall has occurred during this admission (document High fall risk)  Patient Fall Risk Level High fall risk  Adult Fall Risk Interventions  Required Bundle Interventions *See Row Information* High fall risk - low, moderate, and high requirements implemented  Additional Interventions Use of appropriate toileting equipment (bedpan, BSC, etc.);Room near nurses station;Safety Sitter/Safety Rounder;Reorient/diversional activities with confused patients;PT/OT need assessed if change in mobility from baseline  Screening for Fall Injury Risk (To be completed on HIGH fall risk patients) - Assessing Need for Floor Mats  Risk For Fall Injury- Criteria for Floor Mats Previous fall this admission;Admitted as a result of a fall  Will Implement Floor Mats Yes  Vitals  BP  (refused)  Oxygen Therapy  SpO2  (refused)  Pain Assessment  Pain Score 0  Neurological  Neuro (WDL) X  Level of Consciousness New agitation confusion  Orientation Level Oriented to place;Oriented to situation;Oriented to time  Cognition Impulsive  Speech Clear  Neuro Symptoms Agitation;Tremors  Glasgow Coma Scale  Eye Opening 4  Best Verbal Response (NON-intubated) 4  Best Motor Response 6  Glasgow Coma Scale Score 14  Musculoskeletal  Musculoskeletal (WDL) WDL  Musculoskeletal Details  RUE Full movement  LUE Full movement  RLE Full movement  LLE Full movement  Integumentary  Integumentary (WDL) WDL

## 2022-01-30 NOTE — Progress Notes (Signed)
OT Cancellation Note  Patient Details Name: Todd [name] MRN: [id number] DOB: 04/18/1956   Cancelled Treatment:    Reason Eval/Treat Not Completed: Patient declined, no reason specified (Patient declined participating with OT at this time. Patient stated it wasn't a good time and to try again in 2 hours. OT to attempt again as schedule permits.) Lodema Hong, [place]  Office [place] 01/30/2022, 9:24 AM

## 2022-01-30 NOTE — Progress Notes (Signed)
Haldol 1mg  iv given.pt seemed little calm and went to sleep.will continue to monitor.

## 2022-01-30 NOTE — TOC Progression Note (Signed)
Transition of Care [facility]) - Progression Note    Patient Details  Name: Todd [name] MRN: [id number] Date of Birth: [birth year]  Transition of [facility]) CM/SW [place], LCSW Phone Number: 01/30/2022, 2:31 PM  Clinical Narrative:    CSW spoke with patient's niece. She reported agreement with change in plan to SNF. She confirmed she received the bed offers list from the previous CSW and will begin reviewing them.    Expected Discharge Plan: [place] Barriers to Discharge: Continued Medical Work up, Ship broker  Expected Discharge Plan and Services Expected Discharge Plan: [place] In-house Referral: Clinical Social Work Discharge Planning Services: CM Consult Post Acute Care Choice: [place] arrangements for the past 2 months: Single Family Home                 DME Arranged: N/A DME Agency: NA       HH Arranged: PT, OT HH Agency: [place] Date [place]: 01/23/22 Time [place]: 78 Representative spoke with at [place]: [place] Determinants of Health ([place]) Interventions    Readmission Risk Interventions     No data to display

## 2022-01-31 DIAGNOSIS — G9341 Metabolic encephalopathy: Secondary | ICD-10-CM | POA: Diagnosis not present

## 2022-01-31 DIAGNOSIS — G20 Parkinson's disease: Secondary | ICD-10-CM | POA: Diagnosis not present

## 2022-01-31 LAB — COMPREHENSIVE METABOLIC PANEL
ALT: 96 U/L — ABNORMAL HIGH (ref 0–44)
AST: 146 U/L — ABNORMAL HIGH (ref 15–41)
Albumin: 2.6 g/dL — ABNORMAL LOW (ref 3.5–5.0)
Alkaline Phosphatase: 55 U/L (ref 38–126)
Anion gap: 11 (ref 5–15)
BUN: 17 mg/dL (ref 8–23)
CO2: 23 mmol/L (ref 22–32)
Calcium: 8.5 mg/dL — ABNORMAL LOW (ref 8.9–10.3)
Chloride: 105 mmol/L (ref 98–111)
Creatinine, Ser: 1.02 mg/dL (ref 0.61–1.24)
GFR, Estimated: >60 mL/min (ref >60)
Glucose, Bld: 109 mg/dL — ABNORMAL HIGH (ref 70–99)
Potassium: 3.5 mmol/L (ref 3.5–5.1)
Sodium: 139 mmol/L (ref 135–145)
Total Bilirubin: 1.9 mg/dL — ABNORMAL HIGH (ref 0.3–1.2)
Total Protein: 5.8 g/dL — ABNORMAL LOW (ref 6.5–8.1)

## 2022-01-31 LAB — CBC WITH DIFFERENTIAL/PLATELET
Abs Immature Granulocytes: 0.08 10*3/uL — ABNORMAL HIGH (ref 0.00–0.07)
Basophils Absolute: 0 10*3/uL (ref 0.0–0.1)
Basophils Relative: 0 %
Eosinophils Absolute: 0.1 10*3/uL (ref 0.0–0.5)
Eosinophils Relative: 1 %
HCT: 40.1 % (ref 39.0–52.0)
Hemoglobin: 13.3 g/dL (ref 13.0–17.0)
Immature Granulocytes: 1 %
Lymphocytes Relative: 11 %
Lymphs Abs: 1.3 10*3/uL (ref 0.7–4.0)
MCH: 32.6 pg (ref 26.0–34.0)
MCHC: 33.2 g/dL (ref 30.0–36.0)
MCV: 98.3 fL (ref 80.0–100.0)
Monocytes Absolute: 1.4 10*3/uL — ABNORMAL HIGH (ref 0.1–1.0)
Monocytes Relative: 11 %
Neutro Abs: 9.3 10*3/uL — ABNORMAL HIGH (ref 1.7–7.7)
Neutrophils Relative %: 76 %
Platelets: 238 10*3/uL (ref 150–400)
RBC: 4.08 MIL/uL — ABNORMAL LOW (ref 4.22–5.81)
RDW: 12 % (ref 11.5–15.5)
WBC: 12.2 10*3/uL — ABNORMAL HIGH (ref 4.0–10.5)
nRBC: 0 % (ref 0.0–0.2)

## 2022-01-31 LAB — C-REACTIVE PROTEIN: CRP: 9.8 mg/dL — ABNORMAL HIGH (ref <1.0)

## 2022-01-31 LAB — MAGNESIUM: Magnesium: 2 mg/dL (ref 1.7–2.4)

## 2022-01-31 LAB — PROCALCITONIN: Procalcitonin: 0.47 ng/mL

## 2022-01-31 NOTE — Progress Notes (Addendum)
PROGRESS NOTE                                                                                                                                                                                                             Patient Demographics:    Todd [name], is a 66 y.o. male, DOB - Sep 11, 1955, IDP:[id number]  Outpatient Primary MD for the patient is Rinaldo Cloud, MD    LOS - 10  Admit date - 01/20/2022    Chief Complaint  Patient presents with   Fall       Brief Narrative (HPI from H&P)  66 year old male with past medical history of hyperlipidemia, hypothyroidism, hypertension and parkinsonism (follows with Dr. Terrace Arabia with Neurology)  who presents to [facility] emergency department via EMS status post fall.  Patient who lives with his sister claims that he mowed his lawn on Friday morning and became dehydrated and weak, subsequently he went inside his house and sustained a fall, according to the sister he was on the floor for 24 hours in fact she made up a bed for him on the floor so that he could sleep, about 12 hours into the fall he started getting little confused, he was subsequently brought to the ER as he was getting confused and was unable to get off the floor.  In the ER CT head and MRI brain were unremarkable, soon after he was seen by the admitting physician around 2 AM he went into what looks like alcohol withdrawal.  He was started on CIWA protocol.  Also his work-up subsequently came back positive for COVID-19 infection, he is currently admitted for dehydration, generalized weakness, metabolic encephalopathy, possible early DTs and COVID-19 infection.   Subjective:   No significant events overnight as discussed with staff, patient is confused, he denies any complaints.   Assessment  & Plan :    Dehydration, mild COVID-19 infection, fall at home with acute metabolic encephalopathy  - some cute decline  likely due to acute COVID-19 infection as well, does have mild inflammation as suggested by borderline D-dimer and CRP, chest x-ray stable and he is not hypoxic, continue to hydrate with IV fluids, trial of IV Decadron, PT OT.  Still extremely weak although eager to go home however reportedly he lives with his sister and she is sick as well.  Thorough  work-up including CT, CTA head and neck, MRI brain and EEG unremarkable, stable TSH, B12 and RPR.  At low-dose Seroquel twice daily started on 01/25/2022 dose adjusted on 01/26/2022 to assist with encephalopathy and agitation and monitor.  Still significantly weak physically requiring PT OT, will require SNF.  Social work requested to start working on discharge.  Case discussed with neurologist Dr. Bing Neighbors on 01/26/2022 who reviewed the chart and recommended that she had nothing else to add except for outpatient [facility] neurology follow-up with Dr. Terrace Arabia.  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  He will require SNF.  TOC has been informed   SIRS (systemic inflammatory response syndrome) (HCC) - likely due to COVID-19 infection, stable after IV fluids continue to monitor.  Kindly see above.   Aspiration pneumonia not present on admission along with Klebsiella UTI.  On empiric antibiotics x 5 days.  Speech following.  Continue to monitor remains at risk for aspiration.  Rhabdomyolysis- Patient presenting after lying down in his bedroom for over 24 hours with an markedly elevated creatinine kinase, due to fall and staying on the floor for 24 hours.  Hydrated with IV fluids.  Elevated troponin level not due myocardial infarction - Slightly elevated serial troponins with flat trajectory of elevation, Patient is chest pain-free, EKG nonacute continue to monitor clinically.  Home dose aspirin, beta-blocker  and statin resumed for secondary prevention.  Parkinsonism (HCC) - Follows with Dr. Terrace Arabia in the outpatient setting, Trial of Sinemet was recently discontinued earlier in the year due to being ineffective.  Monitor with supportive care.   Essential hypertension - hypotensive, all blood admission medications held, syncopized due to hypotension while standing up on 01/29/2022, hydrate with IV fluids and monitor.   Mixed hyperlipidemia - on 10 mg of Lipitor daily and fenofibrate 50 mg daily  Mild asymptomatic transaminitis.  Worse due to bout of hypotension on 01/29/2022, no abdominal pain or tenderness, monitor if trend continues to get worse then right upper quadrant ultrasound.    Prolonged QT interval - keep potassium around 4 and magnesium around 2, Coreg and monitor.  Avoid QT prolonging medications.  CKD 3B.  Baseline creatinine appears to be close to 1.4.  Monitor.  History of alcohol use.  Denies any excessive use states he drinks 1 bottle of beer every other day, is little inconsistent but since he is more awake alert today I doubt he is in DTs and possibly truthful about his alcohol intake.  Continue to monitor.  Monitor on CIWA protocol administer Ativan only if he has agitation as he has resting tremors from Parkinson's.       Condition - Extremely Guarded  Family Communication  :    Myrtis Hopping [phone] on 01/21/22, no response on the phone on 01/22/2022, 01/25/22.  Detailed message left on 01/30/2022.  Nephew and niece bedside on 01/27/2022.  Called niece [name] [phone] uptodate on 01/30/2022   Code Status :  Full  Consults  :  Case discussed with neurologist Dr. Bing Neighbors on 01/26/2022 who reviewed the chart and recommended that she had nothing else to add except for outpatient [facility] neurology follow-up with Dr. Terrace Arabia.  PUD Prophylaxis : PPI   Procedures  :     EEG, CT head, CTA head and neck & MRI brain.  All nonacute.      Disposition Plan  :  Status is: Inpatient  DVT Prophylaxis  :    Place TED hose Start: 01/29/22 1419 enoxaparin (LOVENOX) injection 40 mg Start: 01/21/22 0245   Lab Results  Component Value Date   PLT 238 01/31/2022    Diet :  Diet Order             DIET DYS 3 Room service appropriate? Yes; Fluid consistency: Thin  Diet effective now                    Inpatient Medications  Scheduled Meds:  aspirin EC  81 mg Oral Daily   atorvastatin  10 mg Oral Daily   enoxaparin (LOVENOX) injection  40 mg Subcutaneous Q24H   fenofibrate  54 mg Oral Daily   folic acid  1 mg Oral Daily   metroNIDAZOLE  500 mg Oral Q8H   multivitamin with minerals  1 tablet Oral Daily   omega-3 acid ethyl esters  1 g Oral Daily   pantoprazole  40 mg Oral Daily   thiamine  100 mg Oral Daily   Continuous Infusions:    PRN Meds:.acetaminophen **OR** acetaminophen, albuterol, cloNIDine, haloperidol lactate, hydrALAZINE, menthol-cetylpyridinium, polyethylene glycol  Antibiotics  :    Anti-infectives (From admission, onward)    Start     Dose/Rate Route Frequency Ordered Stop   01/27/22 0645  cefTRIAXone (ROCEPHIN) 1 g in sodium chloride 0.9 % 100 mL IVPB        1 g 200 mL/hr over 30 Minutes Intravenous Every 24 hours 01/27/22 0549 01/31/22 0549   01/27/22 0645  metroNIDAZOLE (FLAGYL) tablet 500 mg        500 mg Oral Every 8 hours 01/27/22 0549 02/01/22 0559   01/26/22 1100  Ampicillin-Sulbactam (UNASYN) 3 g in sodium chloride 0.9 % 100 mL IVPB  Status:  Discontinued        3 g 200 mL/hr over 30 Minutes Intravenous Every 6 hours 01/26/22 1007 01/27/22 0549         Mliss Fritzawood Serine Kea M.D on 01/31/2022 at 3:46 PM  To page go to [email or web address]   [facility] -  Office  [phone]  See all Orders from today for further details    Objective:   Vitals:   01/30/22 1924 01/31/22 0000 01/31/22 0402 01/31/22 1000  BP: 124/87 126/87 111/88 128/86  Pulse: 81 84 83 80  Resp: 19 19 20 17   Temp: 98 F  (36.7 C) 97.9 F (36.6 C) 98.1 F (36.7 C) 98.1 F (36.7 C)  TempSrc: Oral Oral Oral Oral  SpO2: 93% 94% 96% 93%  Weight:      Height:        Wt Readings from Last 3 Encounters:  01/24/22 77 kg  07/13/21 86.2 kg  04/14/21 83 kg     Intake/Output Summary (Last 24 hours) at 01/31/2022 1546 Last data filed at 01/30/2022 2100 Gross per 24 hour  Intake --  Output 500 ml  Net -500 ml     Physical Exam  Awake communicative, significantly confused, with essential Parkinson tremors Symmetrical Chest wall movement, Good air movement bilaterally, CTAB RRR,No Gallops,Rubs or new Murmurs, No Parasternal Heave +ve B.Sounds, Abd Soft, No tenderness, No rebound - guarding or rigidity. No Cyanosis, Clubbing or edema, No new Rash or bruise      Data Review:    CBC Recent Labs  Lab 01/27/22 0953 01/28/22 1104 01/29/22 0809 01/30/22 0808 01/31/22 0656  WBC 9.5 6.9 12.2* 14.1* 12.2*  HGB 15.1 14.7 15.3 13.6  13.3  HCT 44.3 42.7 46.3 40.3 40.1  PLT 135* 159 209 201 238  MCV 97.1 96.0 98.1 96.9 98.3  MCH 33.1 33.0 32.4 32.7 32.6  MCHC 34.1 34.4 33.0 33.7 33.2  RDW 11.8 11.9 11.9 12.0 12.0  LYMPHSABS 0.4* 0.6* 0.7 0.9 1.3  MONOABS 0.8 1.4* 1.9* 1.7* 1.4*  EOSABS 0.0 0.0 0.0 0.0 0.1  BASOSABS 0.0 0.0 0.0 0.0 0.0    Electrolytes Recent Labs  Lab 01/25/22 0333 01/26/22 0522 01/27/22 0953 01/28/22 1104 01/29/22 0809 01/29/22 1020 01/30/22 0808 01/31/22 0656  NA 140 138 139 137 137  --  137 139  K 3.8 3.4* 4.0 3.6 3.9  --  3.7 3.5  CL 105 101 105 104 100  --  105 105  CO2 --  22 23  GLUCOSE 110* 101* 112* 121* 117*  --  105* 109*  BUN --  21 17  CREATININE 1.17 1.28* 1.23 1.24 1.15  --  1.14 1.02  CALCIUM 8.9 8.7* 8.4* 8.3* 9.0  --  8.2* 8.5*  AST 56* 65* 34 27 58*  --  178* 146*  ALT 37 62* 44 31 39  --  96* 96*  ALKPHOS 51 51 62 51 58  --  52 55  BILITOT 2.4* 2.5* 2.5* 1.9* 2.2*  --  2.3* 1.9*  ALBUMIN 3.6 3.4* 3.0* 2.8* 3.0*  --   2.6* 2.6*  MG 2.1  --  1.9 2.0 2.2  --  2.0 2.0  CRP 0.8 10.8* 15.6* 14.1* 11.1*  --  11.1* 9.8*  DDIMER 0.59* 0.53*  --   --   --   --   --   --   PROCALCITON  --  0.36 0.46 0.64  --   --   --  0.47  BNP 59.6  --  53.2 34.6  --  22.4 27.6  --     No results for input(s): "TSH", "T4TOTAL", "T3FREE", "THYROIDAB" in the last 72 hours.  Invalid input(s): "FREET3"  Radiology Reports VAS [place] LOWER EXTREMITY VENOUS (DVT)  Result Date: 01/29/2022  Lower Venous DVT Study Patient Name:  JORIEL STREETY  Date of Exam:   01/28/2022 Medical Rec #: [id number]          Accession #:    [id number] Date of Birth: 13-Mar-1956           Patient Gender: M Patient Age:   25 years Exam Location:  [facility] Procedure:      VAS [place] LOWER EXTREMITY VENOUS (DVT) Referring Phys: Bess Harvest [facility] --------------------------------------------------------------------------------  Indications: "Rapidly rising D-dimer" (D-dimer: 01/25/22 0.59 & 01/26/22 0.53). Other Indications: Covid+. Comparison Study: No previous exams Performing Technologist: Jody Hill RVT, RDMS  Examination Guidelines: A complete evaluation includes B-mode imaging, spectral Doppler, color Doppler, and power Doppler as needed of all accessible portions of each vessel. Bilateral testing is considered an integral part of a complete examination. Limited examinations for reoccurring indications may be performed as noted. The reflux portion of the exam is performed with the patient in reverse Trendelenburg.  +---------+---------------+---------+-----------+----------+--------------+ RIGHT    CompressibilityPhasicitySpontaneityPropertiesThrombus Aging +---------+---------------+---------+-----------+----------+--------------+ CFV      Full           Yes      Yes                                 +---------+---------------+---------+-----------+----------+--------------+  SFJ      Full                                                         +---------+---------------+---------+-----------+----------+--------------+ FV Prox  Full           Yes      Yes                                 +---------+---------------+---------+-----------+----------+--------------+ FV Mid   Full           Yes      Yes                                 +---------+---------------+---------+-----------+----------+--------------+ FV DistalFull           Yes      Yes                                 +---------+---------------+---------+-----------+----------+--------------+ PFV      Full                                                        +---------+---------------+---------+-----------+----------+--------------+ POP      Full           Yes      Yes                                 +---------+---------------+---------+-----------+----------+--------------+ PTV      Full                                                        +---------+---------------+---------+-----------+----------+--------------+ PERO     Full                                                        +---------+---------------+---------+-----------+----------+--------------+   +---------+---------------+---------+-----------+----------+--------------+ LEFT     CompressibilityPhasicitySpontaneityPropertiesThrombus Aging +---------+---------------+---------+-----------+----------+--------------+ CFV      Full           Yes      Yes                                 +---------+---------------+---------+-----------+----------+--------------+ SFJ      Full                                                        +---------+---------------+---------+-----------+----------+--------------+  FV Prox  Full           Yes      Yes                                 +---------+---------------+---------+-----------+----------+--------------+ FV Mid   Full           Yes      Yes                                  +---------+---------------+---------+-----------+----------+--------------+ FV DistalFull           Yes      Yes                                 +---------+---------------+---------+-----------+----------+--------------+ PFV      Full                                                        +---------+---------------+---------+-----------+----------+--------------+ POP      Full           Yes      Yes                                 +---------+---------------+---------+-----------+----------+--------------+ PTV      Full                                                        +---------+---------------+---------+-----------+----------+--------------+ PERO     Full                                                        +---------+---------------+---------+-----------+----------+--------------+     Summary: BILATERAL: - No evidence of deep vein thrombosis seen in the lower extremities, bilaterally. -No evidence of popliteal cyst, bilaterally.   *See table(s) above for measurements and observations. Electronically signed by Monica Martinez MD on 01/29/2022 at 2:55:42 PM.    Final    DG Chest Port 1 View  Result Date: 01/28/2022 CLINICAL DATA:  Shortness of breath. EXAM: PORTABLE CHEST 1 VIEW COMPARISON:  01/26/2022 FINDINGS: 0748 hours. Low volume film. The cardio pericardial silhouette is enlarged. New infrahilar opacity in the medial right lung base suggests pneumonia. The visualized bony structures of the thorax are unremarkable. IMPRESSION: New infrahilar opacity in the medial right lung base suggests pneumonia. Electronically Signed   By: Misty Stanley M.D.   On: 01/28/2022 08:06

## 2022-01-31 NOTE — TOC Progression Note (Addendum)
Transition of Care [facility]) - Progression Note    Patient Details  Name: Todd [name] MRN: [id number] Date of Birth: 15-Dec-1955  Transition of Care [facility]) CM/SW [place], LCSW Phone Number: 01/31/2022, 1:25 PM  Clinical Narrative:    1:25pm-CSW spoke with patient's niece to obtain SNF choice. She stated she will speak with her brother and call CSW back today. CSW started insurance authorization for SNF pending a facility name.   4pm-CSW received return call from [facility]. She requested [facility]. CSW checking bed availability for tomorrow.   4:13pm-[facility] able to accept patient. CSW updated [name].   Expected Discharge Plan: [place] Barriers to Discharge: Continued Medical Work up, Ship broker  Expected Discharge Plan and Services Expected Discharge Plan: [place] In-house Referral: Clinical Social Work Discharge Planning Services: CM Consult Post Acute Care Choice: [place] arrangements for the past 2 months: Single Family Home                 DME Arranged: N/A DME Agency: NA       HH Arranged: PT, OT HH Agency: [place] Date [place]: 01/23/22 Time [place]: 47 Representative spoke with at [place]: [place] Determinants of Health ([place]) Interventions    Readmission Risk Interventions     No data to display

## 2022-01-31 NOTE — Progress Notes (Addendum)
Physical Therapy Treatment Patient Details Name: Todd [name] MRN: [id number] DOB: Sep 02, 1955 Today's Date: 01/31/2022   History of Present Illness 66 y.o. male presents to [facility] hospital on 01/20/2022 s/p 2 falls, experiencing L hip pain, down on the floor for ~24 hours after second fall. + COVID 19 and possible alcohol withdrawal,on CIWA precautions. +confusion  Pt admitted for management of rhabdomyolysis, SIRS. PMH includes HLD, HTN, Parkinsonism.    PT Comments    Pt received in supine, agreeable to therapy session with encouragement, pt making good progress toward goals this date with good effort to improve functional mobility. Pt needing increased time, multimodal cues and up to modA for bed mobility and transfers pt performed short household distance gait trial with RW support and step-ups onto 7" platform with up to +2 minA. Pt agreeable to sit up in chair, did need physical assist to take bites of his food and reports he does not like the flavor of the food, he did not eat most of his meal. Pt asking for assist to call his daughter and niece but calls going to voicemail. Pt continues to benefit from PT services to progress toward functional mobility goals.   Recommendations for follow up therapy are one component of a multi-disciplinary discharge planning process, led by the attending physician.  Recommendations may be updated based on patient status, additional functional criteria and insurance authorization.  Follow Up Recommendations  Skilled nursing-short term rehab (<3 hours/day) Can patient physically be transported by private vehicle: No   Assistance Recommended at Discharge Frequent or constant Supervision/Assistance  Patient can return home with the following Two people to help with walking and/or transfers;Two people to help with bathing/dressing/bathroom;Assistance with cooking/housework;Assistance with feeding;Direct supervision/assist for medications management;Direct  supervision/assist for financial management;Assist for transportation;Help with stairs or ramp for entrance   Equipment Recommendations  Hospital bed;Wheelchair (measurements PT);Wheelchair cushion (measurements PT);BSC/3in1    Recommendations for Other Services       Precautions / Restrictions Precautions Precautions: Fall Precaution Comments: TED hose; pt impulsive Restrictions Weight Bearing Restrictions: No     Mobility  Bed Mobility Overal bed mobility: Needs Assistance Bed Mobility: Sidelying to Sit, Rolling Rolling: Min guard Sidelying to sit: Mod assist       General bed mobility comments: with HoB elevated, provided single step commands to sequence coming to EoB for reaching across to bed rail, requires modA to raise trunk to sit upright.    Transfers Overall transfer level: Needs assistance Equipment used: Rolling walker (2 wheels) Transfers: Sit to/from Stand Sit to Stand: Mod assist, Min assist, +2 safety/equipment           General transfer comment: no dizziness reported with transfers this date; from EOB>RW and RW>chair, BP WFL pre/post transfers    Ambulation/Gait Ambulation/Gait assistance: Min assist, +2 safety/equipment Gait Distance (Feet): 60 Feet Assistive device: Rolling walker (2 wheels) Gait Pattern/deviations: Shuffle, Narrow base of support, Decreased dorsiflexion - right, Decreased dorsiflexion - left Gait velocity: reduced     General Gait Details: cues for improved step length/height; pt responds better to cues for BIG steps consistently. Noisy signal on pulse oximeter during ambulation but VSS once warm blanket placed over his hands which had been cold   Stairs Stairs: Yes Stairs assistance: Min assist, +2 physical assistance Stair Management: Step to pattern, Forwards, Two rails Number of Stairs: 5 General stair comments: single 7" platform step x5 reps, using RW to simulate B handrails, cues for safety/sequencing, minA +2 for  stability and safety.  mildly unsteady.      Balance Overall balance assessment: Needs assistance Sitting-balance support: Feet supported Sitting balance-Leahy Scale: Good     Standing balance support: Reliant on assistive device for balance Standing balance-Leahy Scale:  (Fair to Poor) Standing balance comment: up to minA using RW, Fair static standing with AD                            Cognition Arousal/Alertness: Awake/alert Behavior During Therapy: WFL for tasks assessed/performed Overall Cognitive Status: No family/caregiver present to determine baseline cognitive functioning                   Orientation Level: Disoriented to, Time, Situation   Memory: Decreased short-term memory Following Commands: Follows one step commands consistently, Follows multi-step commands with increased time Safety/Judgement: Decreased awareness of safety, Decreased awareness of deficits Awareness: Emergent   General Comments: increased time to initiate and perform tasks, pt anxious and with paranoid statements at times, but able to be redirected. Pt given assist to call his sister and niece at his request but numbers went to voicemail, numbers written on the board in his room per his request. Pt with parkinsonian tremors and will need assist to eat meals, RN notified.        Exercises      General Comments General comments (skin integrity, edema, etc.): BP stable pre/post transfer, SpO2 with noisy signal (pt has cold hands), RN notified sensor reading [facility] when warm blanket placed over his hands while resting in chair; no increased WOB with exertion.      Pertinent Vitals/Pain Pain Assessment Pain Assessment: No/denies pain Pain Intervention(s): Monitored during session, Repositioned     PT Goals (current goals can now be found in the care plan section) Acute Rehab PT Goals Patient Stated Goal: to go home PT Goal Formulation: With patient Time For Goal Achievement:  02/04/22 Progress towards PT goals: Progressing toward goals    Frequency    Min 3X/week      PT Plan Current plan remains appropriate    Co-Treatment Yes For patient/therapist safety;To address functional/ADL transfers; Mobility/safety with mobility;Balance;Proper use of DME;Strengthening/ROM;    AM-PAC PT "6 Clicks" Mobility   Outcome Measure  Help needed turning from your back to your side while in a flat bed without using bedrails?: A Little Help needed moving from lying on your back to sitting on the side of a flat bed without using bedrails?: A Lot Help needed moving to and from a bed to a chair (including a wheelchair)?: A Lot (mod safety/sequencing cues for this and all items below this date) Help needed standing up from a chair using your arms (e.g., wheelchair or bedside chair)?: A Lot Help needed to walk in hospital room?: A Lot Help needed climbing 3-5 steps with a railing? : A Lot 6 Click Score: 13    End of Session Equipment Utilized During Treatment: Gait belt Activity Tolerance: Patient tolerated treatment well Patient left: in chair;with call bell/phone within reach;with chair alarm set;Other (comment) (tray table in front of him) Nurse Communication: Mobility status;Other (comment) (pulse oximeter noisy signal, may need to be placed on ear or other location (all of his fingers are cold even with new sensor placed not working well); pt needs assist to eat meals and barely ate any of his lunch.) PT Visit Diagnosis: Other abnormalities of gait and mobility (R26.89);Muscle weakness (generalized) (M62.81);Other symptoms and signs involving the nervous system (  R29.898)     Time: 6720-9470 PT Time Calculation (min) (ACUTE ONLY): 44 min  Charges:  $Gait Training: 8-22 mins $Therapeutic Activity: 8-22 mins                     Jamarie Mussa P., PTA Acute Rehabilitation Services Secure Chat Preferred 9a-5:30pm Office: [place] 01/31/2022, 4:02  PM

## 2022-01-31 NOTE — Progress Notes (Signed)
IVT consulted for difficult stick.  Pt has had 11 PIV's placed this admission. Pt has had soft wrist restraints and mittens at one time d/t pulling out PIV's. Currently, all continuous infusions are discontinued.  An order for hydralazine is prn and has not been administered. Spoke w/RN, Antwanique who will speak to provider to assess if a PIV is necessary. Advised will clear consult and to assess vasculature if the need arises.  If provider wants a PIV placed by IVT, IVT will have a discussion w/care team.

## 2022-01-31 NOTE — Progress Notes (Signed)
Occupational Therapy Treatment Patient Details Name: Todd [name] MRN: [id number] DOB: 09-09-1955 Today's Date: 01/31/2022   History of present illness 66 y.o. male presents to [facility] hospital on 01/20/2022 s/p 2 falls, experiencing L hip pain, down on the floor for ~24 hours after second fall. + COVID 19 and possible alcohol withdrawal,on CIWA precautions. +confusion  Pt admitted for management of rhabdomyolysis, SIRS. PMH includes HLD, HTN, Parkinsonism.   OT comments  Patient with good progress toward patient focused goals. Patient able to perform basic mobility with one assist this date.  Remains confused, decreased safety and impulsive, but more appropriate and able to participate with full session.  OT to continue efforts and begin advancing ADL status.  Continue to recommend SNF for post acute rehab, depending on progress.     Recommendations for follow up therapy are one component of a multi-disciplinary discharge planning process, led by the attending physician.  Recommendations may be updated based on patient status, additional functional criteria and insurance authorization.    Follow Up Recommendations  Skilled nursing-short term rehab (<3 hours/day)    Assistance Recommended at Discharge Frequent or constant Supervision/Assistance  Patient can return home with the following  Assistance with cooking/housework;Direct supervision/assist for medications management;Direct supervision/assist for financial management;Assist for transportation;Help with stairs or ramp for entrance;A little help with walking and/or transfers;A little help with bathing/dressing/bathroom   Equipment Recommendations       Recommendations for Other Services      Precautions / Restrictions Precautions Precautions: Fall Restrictions Weight Bearing Restrictions: No       Mobility Bed Mobility   Bed Mobility: Sidelying to Sit   Sidelying to sit: Mod assist            Transfers Overall transfer  level: Needs assistance Equipment used: Rolling walker (2 wheels) Transfers: Sit to/from Stand Sit to Stand: Mod assist, Min assist     Step pivot transfers: Min assist           Balance Overall balance assessment: Needs assistance Sitting-balance support: Feet supported Sitting balance-Leahy Scale: Good     Standing balance support: Reliant on assistive device for balance Standing balance-Leahy Scale: Fair                             ADL either performed or assessed with clinical judgement   ADL                   Upper Body Dressing : Minimal assistance;Sitting   Lower Body Dressing: Moderate assistance;Sit to/from stand   Toilet Transfer: Minimal assistance;Rolling walker (2 wheels);Regular Toilet                  Extremity/Trunk Assessment                                Cognition Arousal/Alertness: Awake/alert Behavior During Therapy: WFL for tasks assessed/performed Overall Cognitive Status: No family/caregiver present to determine baseline cognitive functioning                   Orientation Level: Disoriented to, Time, Situation     Following Commands: Follows one step commands consistently, Follows multi-step commands with increased time Safety/Judgement: Decreased awareness of safety, Decreased awareness of deficits Awareness: Emergent  Pertinent Vitals/ Pain       Pain Assessment Pain Assessment: No/denies pain Pain Intervention(s): Monitored during session                                                          Frequency  Min 2X/week        Progress Toward Goals  OT Goals(current goals can now be found in the care plan section)  Progress towards OT goals: Progressing toward goals  Acute Rehab OT Goals OT Goal Formulation: With patient Time For Goal Achievement: 02/05/22 Potential to Achieve Goals: Good  Plan Discharge plan  remains appropriate    Co-evaluation                 AM-PAC OT "6 Clicks" Daily Activity     Outcome Measure   Help from another person eating meals?: None Help from another person taking care of personal grooming?: A Little Help from another person toileting, which includes using toliet, bedpan, or urinal?: A Lot Help from another person bathing (including washing, rinsing, drying)?: A Lot Help from another person to put on and taking off regular upper body clothing?: A Little Help from another person to put on and taking off regular lower body clothing?: A Lot 6 Click Score: 16    End of Session Equipment Utilized During Treatment: Rolling walker (2 wheels);Gait belt  OT Visit Diagnosis: Unsteadiness on feet (R26.81);Other abnormalities of gait and mobility (R26.89);Muscle weakness (generalized) (M62.81);Cognitive communication deficit (R41.841);History of falling (Z91.81)   Activity Tolerance Patient tolerated treatment well   Patient Left with call bell/phone within reach;in chair;with chair alarm set   Nurse Communication Mobility status        Time: 1420-1455 OT Time Calculation (min): 35 min  Charges: OT General Charges $OT Visit: 1 Visit OT Treatments $Self Care/Home Management : 8-22 mins  01/31/2022  RP, OTR/L  Acute Rehabilitation Services  Office:  [phone]   Metta Clines 01/31/2022, 3:00 PM

## 2022-02-01 DIAGNOSIS — G9341 Metabolic encephalopathy: Secondary | ICD-10-CM | POA: Diagnosis not present

## 2022-02-01 DIAGNOSIS — R778 Other specified abnormalities of plasma proteins: Secondary | ICD-10-CM | POA: Diagnosis not present

## 2022-02-01 DIAGNOSIS — I10 Essential (primary) hypertension: Secondary | ICD-10-CM | POA: Diagnosis not present

## 2022-02-01 DIAGNOSIS — W19XXXA Unspecified fall, initial encounter: Secondary | ICD-10-CM | POA: Diagnosis not present

## 2022-02-01 LAB — CULTURE, BLOOD (ROUTINE X 2)
Culture: NO GROWTH
Culture: NO GROWTH
Special Requests: ADEQUATE
Special Requests: ADEQUATE

## 2022-02-01 LAB — CBC WITH DIFFERENTIAL/PLATELET
Abs Immature Granulocytes: 0.07 10*3/uL (ref 0.00–0.07)
Basophils Absolute: 0 10*3/uL (ref 0.0–0.1)
Basophils Relative: 0 %
Eosinophils Absolute: 0.1 10*3/uL (ref 0.0–0.5)
Eosinophils Relative: 0 %
HCT: 41.4 % (ref 39.0–52.0)
Hemoglobin: 13.8 g/dL (ref 13.0–17.0)
Immature Granulocytes: 1 %
Lymphocytes Relative: 9 %
Lymphs Abs: 1.1 10*3/uL (ref 0.7–4.0)
MCH: 32.6 pg (ref 26.0–34.0)
MCHC: 33.3 g/dL (ref 30.0–36.0)
MCV: 97.9 fL (ref 80.0–100.0)
Monocytes Absolute: 1 10*3/uL (ref 0.1–1.0)
Monocytes Relative: 8 %
Neutro Abs: 9.8 10*3/uL — ABNORMAL HIGH (ref 1.7–7.7)
Neutrophils Relative %: 82 %
Platelets: 343 10*3/uL (ref 150–400)
RBC: 4.23 MIL/uL (ref 4.22–5.81)
RDW: 12.1 % (ref 11.5–15.5)
WBC: 12 10*3/uL — ABNORMAL HIGH (ref 4.0–10.5)
nRBC: 0 % (ref 0.0–0.2)

## 2022-02-01 LAB — COMPREHENSIVE METABOLIC PANEL
ALT: 105 U/L — ABNORMAL HIGH (ref 0–44)
AST: 129 U/L — ABNORMAL HIGH (ref 15–41)
Albumin: 2.9 g/dL — ABNORMAL LOW (ref 3.5–5.0)
Alkaline Phosphatase: 65 U/L (ref 38–126)
Anion gap: 10 (ref 5–15)
BUN: 17 mg/dL (ref 8–23)
CO2: 23 mmol/L (ref 22–32)
Calcium: 8.5 mg/dL — ABNORMAL LOW (ref 8.9–10.3)
Chloride: 103 mmol/L (ref 98–111)
Creatinine, Ser: 1.03 mg/dL (ref 0.61–1.24)
GFR, Estimated: >60 mL/min (ref >60)
Glucose, Bld: 108 mg/dL — ABNORMAL HIGH (ref 70–99)
Potassium: 3.5 mmol/L (ref 3.5–5.1)
Sodium: 136 mmol/L (ref 135–145)
Total Bilirubin: 2.2 mg/dL — ABNORMAL HIGH (ref 0.3–1.2)
Total Protein: 6.5 g/dL (ref 6.5–8.1)

## 2022-02-01 LAB — PROCALCITONIN: Procalcitonin: 0.34 ng/mL

## 2022-02-01 LAB — MAGNESIUM: Magnesium: 2.1 mg/dL (ref 1.7–2.4)

## 2022-02-01 LAB — C-REACTIVE PROTEIN: CRP: 8 mg/dL — ABNORMAL HIGH (ref <1.0)

## 2022-02-01 MED ORDER — MIDODRINE HCL 5 MG PO TABS: 2.5000 mg | ORAL_TABLET | Freq: Two times a day (BID) | ORAL | 0 refills | Status: AC

## 2022-02-01 MED ORDER — MIDODRINE HCL 5 MG PO TABS
5.0000 mg | ORAL_TABLET | Freq: Two times a day (BID) | ORAL | Status: DC
  Administered 2022-02-01: 5 mg via ORAL
  Filled 2022-02-01: qty 1

## 2022-02-01 MED ORDER — FOLIC ACID 1 MG PO TABS: 1.0000 mg | ORAL_TABLET | Freq: Every day | ORAL | Status: AC

## 2022-02-01 MED ORDER — ADULT MULTIVITAMIN W/MINERALS CH: 1.0000 | ORAL_TABLET | Freq: Every day | ORAL | Status: AC

## 2022-02-01 MED ORDER — VITAMIN B-1 100 MG PO TABS: 100.0000 mg | ORAL_TABLET | Freq: Every day | ORAL | Status: AC

## 2022-02-01 NOTE — TOC Transition Note (Signed)
Transition of Care [facility]) - CM/SW Discharge Note   Patient Details  Name: Todd [name] MRN: [id number] Date of Birth: 27-Oct-1955  Transition of Care [facility]) CM/SW Contact:  Cyndi Bender, RN Phone Number: 02/01/2022, 9:50 AM   Clinical Narrative:     Patient is stable for discharge. Spoke to [place], niece, regarding transition needs. Patient wants to go home with home health. Family will be staying with patient. Todd [name] states patient has all needed DME and declines hospital bed and wheelchair. Family will pick up patient at 1500 today. New PCP apt has been made. Dr. Doy Hutching will sign home health orders.   Address, Phone number verified   Final next level of care: [place] Barriers to Discharge: Barriers Resolved   Patient Goals and CMS Choice Patient states their goals for this hospitalization and ongoing recovery are:: To return home CMS Medicare.gov Compare Post Acute Care list provided to:: Patient Choice offered to / list presented to :  (Niece)  Discharge Placement               home        Discharge Plan and Services In-house Referral: PCP / [facility] Discharge Planning Services: Follow-up appt scheduled, CM Consult Post Acute Care Choice: Home Health          DME Arranged: N/A DME Agency: NA       HH Arranged: PT, OT, Nurse's Aide, Social Work [employer] Agency: [place] Date [place]: 01/23/22 Time [place]: [place] Representative spoke with at [place]: [place] Determinants of Health ([place]) Interventions     Readmission Risk Interventions    02/01/2022    9:49 AM  Readmission Risk Prevention Plan  Post Dischage Appt Complete  Medication Screening Complete  Transportation Screening Complete

## 2022-02-01 NOTE — Discharge Summary (Signed)
Physician Discharge Summary  Todd [name] WUJ:[id number] DOB: May 24, 1955 DOA: 01/20/2022  PCP: Rinaldo Cloud, MD  Admit date: 01/20/2022 Discharge date: 02/01/2022  Admitted From: Home, Disposition:  Home with home health, family offered subacute rehab, but they would rather go to home with home health.  Recommendations for Outpatient Follow-up:  Follow up with PCP in 1-2 weeks Please obtain BMP/CBC /LFT in one week Rest antihypertensive medication as needed, as all meds has been stopped, actually he was started on low-dose midodrine for soft blood pressure.  Home Health:YES   Discharge Condition:Stable CODE STATUS:FULL Diet recommendation: Heart Healthy / Carb Modified   Brief/Interim Summary:   66 year old male with past medical history of hyperlipidemia, hypothyroidism, hypertension and parkinsonism (follows with Dr. Terrace Arabia with Neurology)  who presents to [facility] emergency department via EMS status post fall.   Patient who lives with his sister claims that he mowed his lawn on Friday morning and became dehydrated and weak, subsequently he went inside his house and sustained a fall, according to the sister he was on the floor for 24 hours in fact she made up a bed for him on the floor so that he could sleep, about 12 hours into the fall he started getting little confused, he was subsequently brought to the ER as he was getting confused and was unable to get off the floor.  In the ER CT head and MRI brain were unremarkable, soon after he was seen by the admitting physician around 2 AM he went into what looks like alcohol withdrawal.  He was started on CIWA protocol.  Also his work-up subsequently came back positive for COVID-19 infection, he is currently admitted for dehydration, generalized weakness, metabolic encephalopathy, possible early DTs and COVID-19 infection.    Dehydration, mild COVID-19 infection, fall at home with acute metabolic encephalopathy  - some cute  decline likely due to acute COVID-19 infection as well, does have mild inflammation as suggested by borderline D-dimer and CRP, chest x-ray stable and he is not hypoxic, he was treated with IV fluids, and IV Decadron . -No Hypoxia, no further treatment needed for COVID . -Thorough work-up including CT, CTA head and neck, MRI brain and EEG unremarkable, stable TSH, B12 and RPR.      Aspiration pneumonia not present on admission along with Klebsiella UTI.  On empiric antibiotics x 5 days.   Rhabdomyolysis-  Patient presenting after lying down in his bedroom for over 24 hours with an markedly elevated creatinine kinase, due to fall and staying on the floor for 24 hours.  Treated with IV fluids   Elevated troponin level not due myocardial infarction - Slightly elevated serial troponins with flat trajectory of elevation, Patient is chest pain-free, EKG nonacute    Parkinsonism (HCC) -  Follows with Dr. Terrace Arabia in the outpatient setting, Trial of Sinemet was recently discontinued earlier in the year due to being ineffective.  Monitor with supportive care.     Essential hypertension  - hypotensive, all blood admission medications held, syncopized due to hypotension while standing up on 01/29/2022, hydrate with IV fluids and monitor. 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   Mixed hyperlipidemia - on 10 mg of Lipitor daily and fenofibrate 50 mg daily   Mild asymptomatic transaminitis.   - Worse due to bout of hypotension on 01/29/2022, no abdominal pain or tenderness, trending down, will hold statin on discharge, recheck disease as an outpatient and resume statin if  continues to improve.   Prolonged QT interval - keep potassium around 4 and magnesium around 2, Coreg and monitor.  Avoid QT prolonging medications.   CKD 3B.  Baseline creatinine appears to be close to 1.4.    History of alcohol use.  Denies any excessive use states  he drinks 1 bottle of beer every other day, no evidence of withdrawals during hospital stay, continue with thiamine and folic acid      Discharge Diagnoses:  Principal Problem:   Acute metabolic encephalopathy Active Problems:   SIRS (systemic inflammatory response syndrome) (HCC)   Rhabdomyolysis   Elevated troponin level not due myocardial infarction   Fall at home, initial encounter   Generalized weakness   Parkinsonism ([place])   Essential hypertension   Mixed hyperlipidemia   Prolonged QT interval   Weakness    Discharge Instructions  Discharge Instructions     Diet - low sodium heart healthy   Complete by: As directed    Diet - low sodium heart healthy   Complete by: As directed    Increase activity slowly   Complete by: As directed    Increase activity slowly   Complete by: As directed       Allergies as of 02/01/2022   No Known Allergies      Medication List     STOP taking these medications    atorvastatin 10 MG tablet Commonly known as: LIPITOR   carvedilol 3.125 MG tablet Commonly known as: COREG   metoprolol succinate 100 MG 24 hr tablet Commonly known as: TOPROL-XL   oxyCODONE-acetaminophen 10-325 MG tablet Commonly known as: PERCOCET   QUEtiapine 25 MG tablet Commonly known as: SEROquel   valsartan-hydrochlorothiazide 320-25 MG tablet Commonly known as: DIOVAN-HCT       TAKE these medications    aspirin EC 81 MG tablet Take 81 mg by mouth daily.   carbidopa-levodopa 25-100 MG tablet Commonly known as: SINEMET IR Take 1 tablet by mouth 3 (three) times daily.   clonazePAM 1 MG tablet Commonly known as: KLONOPIN Take 1 mg by mouth at bedtime as needed for anxiety.   fenofibrate 50 MG tablet Commonly known as: TRIGLIDE Take 50 mg by mouth daily.   fluticasone 0.005 % ointment Commonly known as: CUTIVATE Apply 1 Application topically 2 (two) times daily.   folic acid 1 MG tablet Commonly known as: FOLVITE Take 1 tablet (1  mg total) by mouth daily.   midodrine 5 MG tablet Commonly known as: PROAMATINE Take 0.5 tablets (2.5 mg total) by mouth 2 (two) times daily with a meal.   multivitamin with minerals Tabs tablet Take 1 tablet by mouth daily.   niacin 500 MG tablet Commonly known as: VITAMIN B3 Take 500 mg by mouth daily.   omega-3 acid ethyl esters 1 g capsule Commonly known as: LOVAZA Take 1 g by mouth daily.   thiamine 100 MG tablet Commonly known as: Vitamin B-1 Take 1 tablet (100 mg total) by mouth daily.        Follow-up Information     [facility] Follow [facility].   Specialty: Home Health Services Why: Someone will call you to schedule first home visit. Contact information: [address] [place] [address] [phone]         Delene Ruffini, MD Follow up.   Specialty: Internal Medicine Why: TIME : 10:45 AM DATE: OCTOBER 03 ,2023 LOCATION : [place] , [address] , [place] ,  [place], [place] [address] Contact information: [address] [place] [address] [phone]                No Known Allergies  Consultations: None   Procedures/Studies: VAS [place] LOWER EXTREMITY VENOUS (DVT)  Result Date: 01/29/2022  Lower Venous DVT Study Patient Name:  JEWETT MCGANN  Date of Exam:   01/28/2022 Medical Rec #: [id number]          Accession #:    [id number] Date of Birth: [birth year]           Patient Gender: M Patient Age:   36 years Exam Location:  [facility] Procedure:      VAS [place] LOWER EXTREMITY VENOUS (DVT) Referring Phys: Bess Harvest [facility] --------------------------------------------------------------------------------  Indications: "Rapidly rising D-dimer" (D-dimer: 01/25/22 0.59 & 01/26/22 0.53). Other Indications: Covid+. Comparison Study: No previous exams Performing Technologist: Jody Hill RVT, RDMS  Examination Guidelines: A complete evaluation includes B-mode imaging, spectral Doppler, color Doppler,  and power Doppler as needed of all accessible portions of each vessel. Bilateral testing is considered an integral part of a complete examination. Limited examinations for reoccurring indications may be performed as noted. The reflux portion of the exam is performed with the patient in reverse Trendelenburg.  +---------+---------------+---------+-----------+----------+--------------+ RIGHT    CompressibilityPhasicitySpontaneityPropertiesThrombus Aging +---------+---------------+---------+-----------+----------+--------------+ CFV      Full           Yes      Yes                                 +---------+---------------+---------+-----------+----------+--------------+ SFJ      Full                                                        +---------+---------------+---------+-----------+----------+--------------+ FV Prox  Full           Yes      Yes                                 +---------+---------------+---------+-----------+----------+--------------+ FV Mid   Full           Yes      Yes                                 +---------+---------------+---------+-----------+----------+--------------+ FV DistalFull           Yes      Yes                                 +---------+---------------+---------+-----------+----------+--------------+ PFV      Full                                                        +---------+---------------+---------+-----------+----------+--------------+ POP      Full           Yes      Yes                                 +---------+---------------+---------+-----------+----------+--------------+  PTV      Full                                                        +---------+---------------+---------+-----------+----------+--------------+ PERO     Full                                                        +---------+---------------+---------+-----------+----------+--------------+    +---------+---------------+---------+-----------+----------+--------------+ LEFT     CompressibilityPhasicitySpontaneityPropertiesThrombus Aging +---------+---------------+---------+-----------+----------+--------------+ CFV      Full           Yes      Yes                                 +---------+---------------+---------+-----------+----------+--------------+ SFJ      Full                                                        +---------+---------------+---------+-----------+----------+--------------+ FV Prox  Full           Yes      Yes                                 +---------+---------------+---------+-----------+----------+--------------+ FV Mid   Full           Yes      Yes                                 +---------+---------------+---------+-----------+----------+--------------+ FV DistalFull           Yes      Yes                                 +---------+---------------+---------+-----------+----------+--------------+ PFV      Full                                                        +---------+---------------+---------+-----------+----------+--------------+ POP      Full           Yes      Yes                                 +---------+---------------+---------+-----------+----------+--------------+ PTV      Full                                                        +---------+---------------+---------+-----------+----------+--------------+  PERO     Full                                                        +---------+---------------+---------+-----------+----------+--------------+     Summary: BILATERAL: - No evidence of deep vein thrombosis seen in the lower extremities, bilaterally. -No evidence of popliteal cyst, bilaterally.   *See table(s) above for measurements and observations. Electronically signed by Sherald Hesshristopher Clark MD on 01/29/2022 at 2:55:42 PM.    Final    DG Chest Port 1 View  Result Date: 01/28/2022 CLINICAL  DATA:  Shortness of breath. EXAM: PORTABLE CHEST 1 VIEW COMPARISON:  01/26/2022 FINDINGS: 0748 hours. Low volume film. The cardio pericardial silhouette is enlarged. New infrahilar opacity in the medial right lung base suggests pneumonia. The visualized bony structures of the thorax are unremarkable. IMPRESSION: New infrahilar opacity in the medial right lung base suggests pneumonia. Electronically Signed   By: Kennith CenterEric  Mansell M.D.   On: 01/28/2022 08:06   DG Chest Port 1 View  Result Date: 01/26/2022 CLINICAL DATA:  Weakness, confusion and COVID positive. EXAM: PORTABLE CHEST 1 VIEW COMPARISON:  01/20/2022 FINDINGS: Lungs are adequately inflated without focal airspace consolidation or effusion. Cardiomediastinal silhouette and remainder of the exam is unchanged. IMPRESSION: No active disease. Electronically Signed   By: Elberta Fortisaniel  Boyle M.D.   On: 01/26/2022 08:04   EEG adult  Result Date: 01/22/2022 Charlsie QuestYadav, Priyanka O, MD     01/22/2022  3:40 PM Patient Name: Todd [name] MRN: [id number] Epilepsy Attending: Charlsie QuestPriyanka O Yadav Referring Physician/Provider: Marinda ElkShalhoub, George J, MD Date: 01/22/2022 Duration: 27.39 mins Patient history: 66yo M with ams. EEG to evaluate for seizure Level of alertness: Awake AEDs during EEG study: None Technical aspects: This EEG study was done with scalp electrodes positioned according to the 10-20 International system of electrode placement. Electrical activity was reviewed with band pass filter of 1-70Hz , sensitivity of 7 uV/mm, display speed of 930mm/sec with a 60Hz  notched filter applied as appropriate. EEG data were recorded continuously and digitally stored.  Video monitoring was available and reviewed as appropriate. Description: The posterior dominant rhythm consists of 8 Hz activity of moderate voltage (25-35 uV) seen predominantly in posterior head regions, symmetric and reactive to eye opening and eye closing. Hyperventilation and photic stimulation were not performed.    IMPRESSION: This study is within normal limits. No seizures or epileptiform discharges were seen throughout the recording. A normal interictal EEG does not exclude nor support the diagnosis of epilepsy. Charlsie Questriyanka O Yadav   MR BRAIN WO CONTRAST  Result Date: 01/20/2022 CLINICAL DATA:  Larey SeatFell out of bed, delirium EXAM: MRI HEAD WITHOUT CONTRAST TECHNIQUE: Multiplanar, multiecho pulse sequences of the brain and surrounding structures were obtained without intravenous contrast. COMPARISON:  No prior MRI, correlation is made with CT head and CTA head neck 01/20/2022 FINDINGS: Brain: No restricted diffusion to suggest acute or subacute infarct. No acute hemorrhage, mass, mass effect, or midline shift. No hemosiderin deposition to suggest remote hemorrhage. No hydrocephalus or extra-axial collection. Scattered T2 hyperintense signal in the periventricular white matter, likely the sequela of mild-to-moderate chronic small vessel ischemic disease. Vascular: Normal arterial flow voids. Skull and upper cervical spine: Normal marrow signal. Sinuses/Orbits: Mucous retention cyst in the left maxillary sinus. Mild mucosal thickening in the maxillary sinuses. The orbits are unremarkable. Other: Trace  fluid in the right mastoid air cells. IMPRESSION: No acute intracranial process. No evidence of acute or subacute infarct. Electronically Signed   By: Wiliam Ke M.D.   On: 01/20/2022 22:12   CT ANGIO HEAD NECK W WO CM  Result Date: 01/20/2022 CLINICAL DATA:  Fall EXAM: CT ANGIOGRAPHY HEAD AND NECK TECHNIQUE: Multidetector CT imaging of the head and neck was performed using the standard protocol during bolus administration of intravenous contrast. Multiplanar CT image reconstructions and MIPs were obtained to evaluate the vascular anatomy. Carotid stenosis measurements (when applicable) are obtained utilizing NASCET criteria, using the distal internal carotid diameter as the denominator. RADIATION DOSE REDUCTION: This exam was  performed according to the departmental dose-optimization program which includes automated exposure control, adjustment of the mA and/or kV according to patient size and/or use of iterative reconstruction technique. CONTRAST:  OMNIPAQUE IOHEXOL 350 MG/ML SOLN COMPARISON:  No prior CTA, correlation is made with 01/20/2022 CT head FINDINGS: CT HEAD FINDINGS CT HEAD FINDINGS For noncontrast findings, please see same day CT head. 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 Imaged portion shows no evidence of aneurysm or dissection. No significant stenosis of the arch vessel origins. Right carotid system: No evidence of dissection, occlusion, or hemodynamically significant stenosis (greater than 50%). Left carotid system: No evidence of dissection, occlusion, or hemodynamically significant stenosis (greater than 50%). Retropharyngeal course of the proximal left ICA. Vertebral arteries: No evidence of dissection, occlusion, or hemodynamically significant stenosis (greater than 50%). Skeleton: No acute osseous abnormality. Degenerative changes in the cervical spine Other neck: Negative. Upper chest: No focal pulmonary opacity or pleural effusion. Review of the MIP images confirms the above findings CTA HEAD FINDINGS Anterior circulation: Both internal carotid arteries are patent to the termini, without significant stenosis. A1 segments patent. Normal anterior communicating artery. Anterior cerebral arteries are patent to their distal aspects. No M1 stenosis or occlusion. MCA branches perfused and symmetric. Posterior circulation: Vertebral arteries patent to the vertebrobasilar junction without stenosis. Basilar patent to its distal aspect. Superior cerebellar arteries patent proximally. Patent P1 segments. PCAs perfused to their distal aspects without stenosis. The left posterior communicating artery is patent. Venous sinuses: As  permitted by contrast timing, patent. Anatomic variants: None significant. Review of the MIP images confirms the above findings IMPRESSION: 1.  No intracranial large vessel occlusion or significant stenosis. 2.  No hemodynamically significant stenosis in the neck. Electronically Signed   By: Wiliam Ke M.D.   On: 01/20/2022 21:44   CT Head Wo Contrast  Result Date: 01/20/2022 CLINICAL DATA:  Fall on Friday and unable to get up. EXAM: CT HEAD WITHOUT CONTRAST TECHNIQUE: Contiguous axial images were obtained from the base of the skull through the vertex without intravenous contrast. RADIATION DOSE REDUCTION: This exam was performed according to the departmental dose-optimization program which includes automated exposure control, adjustment of the mA and/or kV according to patient size and/or use of iterative reconstruction technique. COMPARISON:  None Available. FINDINGS: Brain: Ventricles within normal limits. Mild chronic small vessel ischemic change within the deep periventricular white matter regions bilaterally. No mass, hemorrhage, edema or other evidence of acute parenchymal abnormality. No extra-axial hemorrhage. Vascular: Chronic calcified atherosclerotic changes of the large vessels at the skull base. No unexpected hyperdense vessel. Skull: Normal. Negative for fracture or focal lesion. Sinuses/Orbits: No acute finding. Other: None. IMPRESSION: 1. No acute findings. No intracranial mass, hemorrhage or edema. No skull  fracture. 2. Mild chronic small vessel ischemic change in the deep periventricular white matter regions. Electronically Signed   By: Bary Richard M.D.   On: 01/20/2022 17:30   DG Hip Unilat W or Wo Pelvis 2-3 Views Left  Result Date: 01/20/2022 CLINICAL DATA:  Fall, left hip pain EXAM: DG HIP (WITH OR WITHOUT PELVIS) 2-3V LEFT COMPARISON:  None Available. FINDINGS: There is no evidence of hip fracture or dislocation. There is no evidence of significant arthropathy or other focal bone  abnormality. IMPRESSION: Negative. Electronically Signed   By: Duanne Guess D.O.   On: 01/20/2022 16:28   DG Chest 1 View  Result Date: 01/20/2022 CLINICAL DATA:  Fall EXAM: CHEST  1 VIEW COMPARISON:  None Available. FINDINGS: The heart size and mediastinal contours are within normal limits. Both lungs are clear. The visualized skeletal structures are unremarkable. IMPRESSION: No active disease. Electronically Signed   By: Darliss Cheney M.D.   On: 01/20/2022 16:25      Subjective: Significant events overnight as discussed with staff, patient denies any complaints today, asking to go home today  Discharge Exam: Vitals:   02/01/22 0018 02/01/22 0417  BP: 107/78 90/64  Pulse: 80 65  Resp: 18 16  Temp: 97.8 F (36.6 C) 98 F (36.7 C)  SpO2: 96% 94%   Vitals:   01/31/22 1600 01/31/22 2032 02/01/22 0018 02/01/22 0417  BP: 111/85 114/86 107/78 90/64  Pulse: 91 86 80 65  Resp: (!) Temp: 98 F (36.7 C) 98.6 F (37 C) 97.8 F (36.6 C) 98 F (36.7 C)  TempSrc: Oral Oral Oral Oral  SpO2:  94% 96% 94%  Weight:      Height:        General: Pt is alert, awake, demented, confused Cardiovascular: RRR, S1/S2 +, no rubs, no gallops Respiratory: CTA bilaterally, no wheezing, no rhonchi Abdominal: Soft, NT, ND, bowel sounds + Extremities: no edema, no cyanosis    The results of significant diagnostics from this hospitalization (including imaging, microbiology, ancillary and laboratory) are listed below for reference.     Microbiology: Recent Results (from the past 240 hour(s))  Urine Culture     Status: Abnormal   Collection Time: 01/25/22  1:00 PM   Specimen: Urine, Clean Catch  Result Value Ref Range Status   Specimen Description URINE, CLEAN CATCH  Final   Special Requests   Final    NONE Performed at [facility], [address], [place], [place] [address]    Culture (A)  Final    >=100,000 COLONIES/mL KLEBSIELLA PNEUMONIAE 30,000 COLONIES/mL PROTEUS  MIRABILIS    Report Status 01/29/2022 FINAL  Final   Organism ID, Bacteria KLEBSIELLA PNEUMONIAE (A)  Final   Organism ID, Bacteria PROTEUS MIRABILIS (A)  Final      Susceptibility   Klebsiella pneumoniae - MIC*    AMPICILLIN RESISTANT Resistant     CEFAZOLIN <=4 SENSITIVE Sensitive     CEFEPIME <=0.12 SENSITIVE Sensitive     CEFTRIAXONE <=0.25 SENSITIVE Sensitive     CIPROFLOXACIN <=0.25 SENSITIVE Sensitive     GENTAMICIN <=1 SENSITIVE Sensitive     IMIPENEM <=0.25 SENSITIVE Sensitive     NITROFURANTOIN 32 SENSITIVE Sensitive     TRIMETH/SULFA <=20 SENSITIVE Sensitive     AMPICILLIN/SULBACTAM 4 SENSITIVE Sensitive     PIP/TAZO <=4 SENSITIVE Sensitive     * >=100,000 COLONIES/mL KLEBSIELLA PNEUMONIAE   Proteus mirabilis - MIC*    AMPICILLIN <=2 SENSITIVE Sensitive  CEFAZOLIN <=4 SENSITIVE Sensitive     CEFEPIME <=0.12 SENSITIVE Sensitive     CEFTRIAXONE <=0.25 SENSITIVE Sensitive     CIPROFLOXACIN <=0.25 SENSITIVE Sensitive     GENTAMICIN 2 SENSITIVE Sensitive     IMIPENEM 2 SENSITIVE Sensitive     NITROFURANTOIN RESISTANT Resistant     TRIMETH/SULFA <=20 SENSITIVE Sensitive     AMPICILLIN/SULBACTAM <=2 SENSITIVE Sensitive     PIP/TAZO <=4 SENSITIVE Sensitive     * 30,000 COLONIES/mL PROTEUS MIRABILIS  MRSA Next Gen by PCR, Nasal     Status: None   Collection Time: 01/27/22 11:20 AM   Specimen: Nasal Mucosa; Nasal Swab  Result Value Ref Range Status   MRSA by PCR Next Gen NOT DETECTED NOT DETECTED Final    Comment: (NOTE) The GeneXpert MRSA Assay (FDA approved for NASAL specimens only), is one component of a comprehensive MRSA colonization surveillance program. It is not intended to diagnose MRSA infection nor to guide or monitor treatment for MRSA infections. Test performance is not FDA approved in patients less than 61 years old. Performed at [facility], [address], [place], [place] [address]   Culture, blood (Routine X 2) w Reflex to ID Panel      Status: None   Collection Time: 01/27/22 12:25 PM   Specimen: BLOOD  Result Value Ref Range Status   Specimen Description BLOOD LEFT ANTECUBITAL  Final   Special Requests   Final    BOTTLES DRAWN AEROBIC AND ANAEROBIC Blood Culture adequate volume   Culture   Final    NO GROWTH 5 DAYS Performed at [facility], [address], [place], [place] [address]    Report Status 02/01/2022 FINAL  Final  Culture, blood (Routine X 2) w Reflex to ID Panel     Status: None   Collection Time: 01/27/22 12:25 PM   Specimen: BLOOD LEFT HAND  Result Value Ref Range Status   Specimen Description BLOOD LEFT HAND  Final   Special Requests   Final    BOTTLES DRAWN AEROBIC AND ANAEROBIC Blood Culture adequate volume   Culture   Final    NO GROWTH 5 DAYS Performed at [facility], [address], [place], [place] [id number]    Report Status 02/01/2022 FINAL  Final     Labs: BNP (last 3 results) Recent Labs    01/28/22 1104 01/29/22 1020 01/30/22 0808  BNP 34.6 22.4 27.6   Basic Metabolic Panel: Recent Labs  Lab 01/28/22 1104 01/29/22 0809 01/30/22 0808 01/31/22 0656 02/01/22 0921  NA 137 137 137 139 136  K 3.6 3.9 3.7 3.5 3.5  CL 104 100 105 105 103  CO2 GLUCOSE 121* 117* 105* 109* 108*  BUN CREATININE 1.24 1.15 1.14 1.02 1.03  CALCIUM 8.3* 9.0 8.2* 8.5* 8.5*  MG 2.0 2.2 2.0 2.0 2.1   Liver Function Tests: Recent Labs  Lab 01/28/22 1104 01/29/22 0809 01/30/22 0808 01/31/22 0656 02/01/22 0921  AST 27 58* 178* 146* 129*  ALT 31 39 96* 96* 105*  ALKPHOS 51 58 52 55 65  BILITOT 1.9* 2.2* 2.3* 1.9* 2.2*  PROT 5.8* 6.6 5.7* 5.8* 6.5  ALBUMIN 2.8* 3.0* 2.6* 2.6* 2.9*   No results for input(s): "LIPASE", "AMYLASE" in the last 168 hours. No results for input(s): "AMMONIA" in the last 168 hours. CBC: Recent Labs  Lab 01/28/22 1104 01/29/22 0809 01/30/22 9147 01/31/22 0656 02/01/22  0921  WBC 6.9 12.2* 14.1* 12.2* 12.0*  NEUTROABS  4.8 9.4* 11.4* 9.3* 9.8*  HGB 14.7 15.3 13.6 13.3 13.8  HCT 42.7 46.3 40.3 40.1 41.4  MCV 96.0 98.1 96.9 98.3 97.9  PLT 159 209 201 238 343   Cardiac Enzymes: No results for input(s): "CKTOTAL", "CKMB", "CKMBINDEX", "TROPONINI" in the last 168 hours. BNP: Invalid input(s): "POCBNP" CBG: Recent Labs  Lab 01/29/22 1440  GLUCAP 154*   D-Dimer No results for input(s): "DDIMER" in the last 72 hours. Hgb A1c No results for input(s): "HGBA1C" in the last 72 hours. Lipid Profile No results for input(s): "CHOL", "HDL", "LDLCALC", "TRIG", "CHOLHDL", "LDLDIRECT" in the last 72 hours. Thyroid function studies No results for input(s): "TSH", "T4TOTAL", "T3FREE", "THYROIDAB" in the last 72 hours.  Invalid input(s): "FREET3" Anemia work up No results for input(s): "VITAMINB12", "FOLATE", "FERRITIN", "TIBC", "IRON", "RETICCTPCT" in the last 72 hours. Urinalysis    Component Value Date/Time   COLORURINE AMBER (A) 01/26/2022 0712   APPEARANCEUR CLOUDY (A) 01/26/2022 0712   LABSPEC 1.024 01/26/2022 0712   PHURINE 6.0 01/26/2022 0712   GLUCOSEU NEGATIVE 01/26/2022 0712   HGBUR SMALL (A) 01/26/2022 0712   BILIRUBINUR NEGATIVE 01/26/2022 0712   KETONESUR 5 (A) 01/26/2022 0712   PROTEINUR 100 (A) 01/26/2022 0712   NITRITE POSITIVE (A) 01/26/2022 0712   LEUKOCYTESUR LARGE (A) 01/26/2022 0712   Sepsis Labs Recent Labs  Lab 01/29/22 0809 01/30/22 0808 01/31/22 0656 02/01/22 0921  WBC 12.2* 14.1* 12.2* 12.0*   Microbiology Recent Results (from the past 240 hour(s))  Urine Culture     Status: Abnormal   Collection Time: 01/25/22  1:00 PM   Specimen: Urine, Clean Catch  Result Value Ref Range Status   Specimen Description URINE, CLEAN CATCH  Final   Special Requests   Final    NONE Performed at [facility], [address], [place], [place] [address]    Culture (A)  Final    >=100,000 COLONIES/mL KLEBSIELLA PNEUMONIAE 30,000 COLONIES/mL PROTEUS MIRABILIS    Report Status  01/29/2022 FINAL  Final   Organism ID, Bacteria KLEBSIELLA PNEUMONIAE (A)  Final   Organism ID, Bacteria PROTEUS MIRABILIS (A)  Final      Susceptibility   Klebsiella pneumoniae - MIC*    AMPICILLIN RESISTANT Resistant     CEFAZOLIN <=4 SENSITIVE Sensitive     CEFEPIME <=0.12 SENSITIVE Sensitive     CEFTRIAXONE <=0.25 SENSITIVE Sensitive     CIPROFLOXACIN <=0.25 SENSITIVE Sensitive     GENTAMICIN <=1 SENSITIVE Sensitive     IMIPENEM <=0.25 SENSITIVE Sensitive     NITROFURANTOIN 32 SENSITIVE Sensitive     TRIMETH/SULFA <=20 SENSITIVE Sensitive     AMPICILLIN/SULBACTAM 4 SENSITIVE Sensitive     PIP/TAZO <=4 SENSITIVE Sensitive     * >=100,000 COLONIES/mL KLEBSIELLA PNEUMONIAE   Proteus mirabilis - MIC*    AMPICILLIN <=2 SENSITIVE Sensitive     CEFAZOLIN <=4 SENSITIVE Sensitive     CEFEPIME <=0.12 SENSITIVE Sensitive     CEFTRIAXONE <=0.25 SENSITIVE Sensitive     CIPROFLOXACIN <=0.25 SENSITIVE Sensitive     GENTAMICIN 2 SENSITIVE Sensitive     IMIPENEM 2 SENSITIVE Sensitive     NITROFURANTOIN RESISTANT Resistant     TRIMETH/SULFA <=20 SENSITIVE Sensitive     AMPICILLIN/SULBACTAM <=2 SENSITIVE Sensitive     PIP/TAZO <=4 SENSITIVE Sensitive     * 30,000 COLONIES/mL PROTEUS MIRABILIS  MRSA Next Gen by PCR, Nasal     Status: None  Collection Time: 01/27/22 11:20 AM   Specimen: Nasal Mucosa; Nasal Swab  Result Value Ref Range Status   MRSA by PCR Next Gen NOT DETECTED NOT DETECTED Final    Comment: (NOTE) The GeneXpert MRSA Assay (FDA approved for NASAL specimens only), is one component of a comprehensive MRSA colonization surveillance program. It is not intended to diagnose MRSA infection nor to guide or monitor treatment for MRSA infections. Test performance is not FDA approved in patients less than 67 years old. Performed at [facility], [address], [place], [place] [address]   Culture, blood (Routine X 2) w Reflex to ID Panel     Status: None   Collection Time:  01/27/22 12:25 PM   Specimen: BLOOD  Result Value Ref Range Status   Specimen Description BLOOD LEFT ANTECUBITAL  Final   Special Requests   Final    BOTTLES DRAWN AEROBIC AND ANAEROBIC Blood Culture adequate volume   Culture   Final    NO GROWTH 5 DAYS Performed at [facility], [address] [place], [place] [address]    Report Status 02/01/2022 FINAL  Final  Culture, blood (Routine X 2) w Reflex to ID Panel     Status: None   Collection Time: 01/27/22 12:25 PM   Specimen: BLOOD LEFT HAND  Result Value Ref Range Status   Specimen Description BLOOD LEFT HAND  Final   Special Requests   Final    BOTTLES DRAWN AEROBIC AND ANAEROBIC Blood Culture adequate volume   Culture   Final    NO GROWTH 5 DAYS Performed at [facility], [address] [place], [place] [address]    Report Status 02/01/2022 FINAL  Final     Time coordinating discharge: Over 30 minutes  SIGNED:   Huey Bienenstock, MD  [facility] 02/01/2022, 12:45 PM Pager   If 7PM-7AM, please contact night-coverage [email or web address]

## 2022-02-01 NOTE — Discharge Instructions (Signed)
Follow with Primary MD Charolette Forward, MD in 7 days   Get CBC, CMP, checked  by Primary MD next visit.    Activity: As tolerated with Full fall precautions use walker/cane & assistance as needed   Disposition Home    Diet: Heart Healthy .   On your next visit with your primary care physician please Get Medicines reviewed and adjusted.   Please request your Prim.MD to go over all Hospital Tests and Procedure/Radiological results at the follow up, please get all Hospital records sent to your Prim MD by signing hospital release before you go home.   If you experience worsening of your admission symptoms, develop shortness of breath, life threatening emergency, suicidal or homicidal thoughts you must seek medical attention immediately by calling 911 or calling your MD immediately  if symptoms less severe.  You Must read complete instructions/literature along with all the possible adverse reactions/side effects for all the Medicines you take and that have been prescribed to you. Take any new Medicines after you have completely understood and accpet all the possible adverse reactions/side effects.   Do not drive, operating heavy machinery, perform activities at heights, swimming or participation in water activities or provide baby sitting services if your were admitted for syncope or siezures until you have seen by Primary MD or a Neurologist and advised to do so again.  Do not drive when taking Pain medications.    Do not take more than prescribed Pain, Sleep and Anxiety Medications  Special Instructions: If you have smoked or chewed Tobacco  in the last 2 yrs please stop smoking, stop any regular Alcohol  and or any Recreational drug use.  Wear Seat belts while driving.   Please note  You were cared for by a hospitalist during your hospital stay. If you have any questions about your discharge medications or the care you received while you were in the hospital after you are  discharged, you can call the unit and asked to speak with the hospitalist on call if the hospitalist that took care of you is not available. Once you are discharged, your primary care physician will handle any further medical issues. Please note that NO REFILLS for any discharge medications will be authorized once you are discharged, as it is imperative that you return to your primary care physician (or establish a relationship with a primary care physician if you do not have one) for your aftercare needs so that they can reassess your need for medications and monitor your lab values.

## 2022-02-01 NOTE — TOC Progression Note (Signed)
Transition of Care [facility]) - Progression Note    Patient Details  Name: Todd [name] MRN: [id number] Date of Birth: 03-29-1956  [facility] of [facility]) CM/SW Louise, LCSW Phone Number: 02/01/2022, 8:45 AM  Clinical Narrative:    CSW received call from patient's niece stating that patient is refusing to go to SNF. She stated family will stay with him. RNCM will follow up on home health and DME needs. Family will pick patient up at 3pm today.   Expected Discharge Plan: [place] Barriers to Discharge: Barriers Resolved  Expected Discharge Plan and Services Expected Discharge Plan: [place] In-house Referral: Clinical Social Work Discharge Planning Services: CM Consult Post Acute Care Choice: [place] arrangements for the past 2 months: Single Family Home                 DME Arranged: N/A DME Agency: NA       HH Arranged: PT, OT HH Agency: [place] Date [place]: 01/23/22 Time [place]: [id number] Representative spoke with at [place]: [place] Determinants of Health ([place]) Interventions    Readmission Risk Interventions     No data to display

## 2022-02-06 DIAGNOSIS — B961 Klebsiella pneumoniae [K. pneumoniae] as the cause of diseases classified elsewhere: Secondary | ICD-10-CM | POA: Diagnosis not present

## 2022-02-06 DIAGNOSIS — I129 Hypertensive chronic kidney disease with stage 1 through stage 4 chronic kidney disease, or unspecified chronic kidney disease: Secondary | ICD-10-CM | POA: Diagnosis not present

## 2022-02-06 DIAGNOSIS — U071 COVID-19: Secondary | ICD-10-CM | POA: Diagnosis not present

## 2022-02-06 DIAGNOSIS — Z8701 Personal history of pneumonia (recurrent): Secondary | ICD-10-CM | POA: Diagnosis not present

## 2022-02-06 DIAGNOSIS — G9341 Metabolic encephalopathy: Secondary | ICD-10-CM | POA: Diagnosis not present

## 2022-02-06 DIAGNOSIS — T796XXD Traumatic ischemia of muscle, subsequent encounter: Secondary | ICD-10-CM | POA: Diagnosis not present

## 2022-02-06 DIAGNOSIS — Z9181 History of falling: Secondary | ICD-10-CM | POA: Diagnosis not present

## 2022-02-06 DIAGNOSIS — N1832 Chronic kidney disease, stage 3b: Secondary | ICD-10-CM | POA: Diagnosis not present

## 2022-02-06 DIAGNOSIS — N390 Urinary tract infection, site not specified: Secondary | ICD-10-CM | POA: Diagnosis not present

## 2022-02-06 DIAGNOSIS — G20 Parkinson's disease: Secondary | ICD-10-CM | POA: Diagnosis not present

## 2022-02-06 DIAGNOSIS — F419 Anxiety disorder, unspecified: Secondary | ICD-10-CM | POA: Diagnosis not present

## 2022-02-06 DIAGNOSIS — Z993 Dependence on wheelchair: Secondary | ICD-10-CM | POA: Diagnosis not present

## 2022-02-06 DIAGNOSIS — E782 Mixed hyperlipidemia: Secondary | ICD-10-CM | POA: Diagnosis not present

## 2022-02-06 DIAGNOSIS — E039 Hypothyroidism, unspecified: Secondary | ICD-10-CM | POA: Diagnosis not present

## 2022-02-12 DIAGNOSIS — B961 Klebsiella pneumoniae [K. pneumoniae] as the cause of diseases classified elsewhere: Secondary | ICD-10-CM | POA: Diagnosis not present

## 2022-02-12 DIAGNOSIS — E039 Hypothyroidism, unspecified: Secondary | ICD-10-CM | POA: Diagnosis not present

## 2022-02-12 DIAGNOSIS — N1832 Chronic kidney disease, stage 3b: Secondary | ICD-10-CM | POA: Diagnosis not present

## 2022-02-12 DIAGNOSIS — G20A1 Parkinson's disease without dyskinesia, without mention of fluctuations: Secondary | ICD-10-CM | POA: Diagnosis not present

## 2022-02-12 DIAGNOSIS — I129 Hypertensive chronic kidney disease with stage 1 through stage 4 chronic kidney disease, or unspecified chronic kidney disease: Secondary | ICD-10-CM | POA: Diagnosis not present

## 2022-02-12 DIAGNOSIS — U071 COVID-19: Secondary | ICD-10-CM | POA: Diagnosis not present

## 2022-02-12 DIAGNOSIS — T796XXD Traumatic ischemia of muscle, subsequent encounter: Secondary | ICD-10-CM | POA: Diagnosis not present

## 2022-02-12 DIAGNOSIS — E782 Mixed hyperlipidemia: Secondary | ICD-10-CM | POA: Diagnosis not present

## 2022-02-12 DIAGNOSIS — Z8701 Personal history of pneumonia (recurrent): Secondary | ICD-10-CM | POA: Diagnosis not present

## 2022-02-12 DIAGNOSIS — G9341 Metabolic encephalopathy: Secondary | ICD-10-CM | POA: Diagnosis not present

## 2022-02-12 DIAGNOSIS — N390 Urinary tract infection, site not specified: Secondary | ICD-10-CM | POA: Diagnosis not present

## 2022-02-12 DIAGNOSIS — F419 Anxiety disorder, unspecified: Secondary | ICD-10-CM | POA: Diagnosis not present

## 2022-02-12 DIAGNOSIS — Z993 Dependence on wheelchair: Secondary | ICD-10-CM | POA: Diagnosis not present

## 2022-02-12 DIAGNOSIS — Z9181 History of falling: Secondary | ICD-10-CM | POA: Diagnosis not present

## 2022-02-13 ENCOUNTER — Encounter: Payer: PPO | Admitting: Internal Medicine

## 2022-02-22 ENCOUNTER — Encounter: Payer: Self-pay | Admitting: Neurology

## 2022-02-22 ENCOUNTER — Ambulatory Visit: Payer: PPO | Admitting: Neurology

## 2022-02-22 VITALS — BP 133/99 | HR 87 | Ht 68.0 in | Wt 172.0 lb

## 2022-02-22 DIAGNOSIS — R251 Tremor, unspecified: Secondary | ICD-10-CM | POA: Diagnosis not present

## 2022-02-22 DIAGNOSIS — G4700 Insomnia, unspecified: Secondary | ICD-10-CM | POA: Diagnosis not present

## 2022-02-22 DIAGNOSIS — G20C Parkinsonism, unspecified: Secondary | ICD-10-CM | POA: Diagnosis not present

## 2022-02-22 MED ORDER — ROPINIROLE HCL ER 2 MG PO TB24: 4.0000 mg | ORAL_TABLET | Freq: Every day | ORAL | 6 refills | Status: DC

## 2022-02-22 MED ORDER — CARBIDOPA-LEVODOPA 25-100 MG PO TABS: 1.5000 | ORAL_TABLET | Freq: Three times a day (TID) | ORAL | 6 refills | Status: DC

## 2022-02-22 NOTE — Progress Notes (Signed)
IV  Chief Complaint  Patient presents with   Follow-up    Rm 14 alone Pt is well, has had one sz that he knows of.  States tremors are worsening since last visit     Todd [name] is a 66 y.o. male   Parkinsonism Cognitive impairment  MOCA 20/30 MRI of brain, generalized atrophy small vessel disease.  Will try higher dose of Sinemet and 25/100   DIAGNOSTIC DATA (LABS, IMAGING, TESTING) - I reviewed patient records, labs, notes, testing and imaging myself where available.   MEDICAL HISTORY:  Todd [name] is a 66 year old male, seen in request by his primary care physician Dr.   Charolette Forward, for evaluation of tremor, difficulty sleeping, initial evaluation was on April 14, 2021.  I reviewed and summarized the referring note. PMhx HTN HLD Anxiety,  He is retired Medical illustrator, used to be very active, runs 6 miles without any difficulty, since retirement few years ago, moved from [place] to [place], become very sedentary, around 2021, he noticed right hand tremor, per history progress quickly, now involving right leg, also left hand, He denies gait abnormality, denies loss sense of smell His main concern is his worsening chronic insomnia, has been ongoing for many years, used to work shift job, was put on clonazepam 1 mg every night for at least for 3 years now, worked for a while, now even after taking clonazepam 1 mg every night, he has difficulty falling to sleep, reported only sleep couple hours every night, waking up very bothered by his right hand shaking,  UPDATE Feb 22 2022: Patient is alone at today's clinical visit, complains of worsening shaking over the past few months, hospital admission on January 20, 2022 after she fell at home,  The day of admission, he mowed his lawn, became dehydrated, felt weak, after he went inside the house, he fell, could not get up, he still lives with his sister, his sister has to make the  bed on the floor for him, about 20 hours into the event, he was noted to be confused,  Personally reviewed ER evaluation, MRI of the brain showed moderate age-appropriate, moderate generalized atrophy, small vessel disease, no acute abnormality CT angiogram head and neck showed no large vessel disease Chest x-ray showed no acute process  Laboratory evaluation showed normal negative HIV, procalcitonin, lactic acid, C-reactive protein, RPR, B12, TSH, UDS was positive for marijuana, C-reactive protein was mildly elevated at 8.2,  He was found to be COVID-positive, diagnosed with metabolic encephalopathy, was treated with IV fluid, IV Decadron, symptoms quickly improved, EEG showed no significant abnormality  He has mild memory loss, MoCA examination 20/30 today   PHYSICAL EXAM:   Vitals:   02/22/22 1248  BP: (!) 133/99  Pulse: 87  Weight: 172 lb (78 kg)  Height: 5\' 8"  (1.727 m)    Not recorded     Body mass index is 26.15 kg/m.  PHYSICAL EXAMNIATION:  Gen: NAD, conversant, well nourised, well groomed                     Cardiovascular: Regular rate rhythm, no peripheral edema, warm, nontender. Eyes: Conjunctivae clear without exudates or hemorrhage Neck: Supple, no carotid bruits. Pulmonary: Clear to auscultation bilaterally   NEUROLOGICAL EXAM:  MENTAL STATUS: Speech/cognition: Awake, alert, oriented to history taking care of conversation     02/22/2022    1:00 PM  Montreal Cognitive Assessment   Visuospatial/ Executive (0/5) 3  Naming (0/3) 3  Attention: Read list of digits (0/2) 1  Attention: Read list of letters (0/1) 0  Attention: Serial 7 subtraction starting at 100 (0/3) 2  Language: Repeat phrase (0/2) 2  Language : Fluency (0/1) 1  Abstraction (0/2) 2  Delayed Recall (0/5) 0  Orientation (0/6) 6  Total 20      CRANIAL NERVES: CN II: Visual fields are full to confrontation. Pupils are round equal and briskly reactive to light. CN III, IV, VI:  extraocular movement are normal. No ptosis. CN V: Facial sensation is intact to light touch CN VII: Face is symmetric with normal eye closure  CN VIII: Hearing is normal to causal conversation. CN IX, X: Phonation is normal. CN XI: Head turning and shoulder shrug are intact  MOTOR: Almost constant right upper extremity resting tremor, right leg shaking intermittently (but seemed to worsen with increased frustration), frequent lesser amplitude of left upper extremity shaking, no weakness, right more than left mild rigidity, bradykinesia,  REFLEXES: Reflexes are 2+ and symmetric at the biceps, triceps, knees, and ankles. Plantar responses are flexor.  SENSORY: Intact to light touch, pinprick and vibratory sensation are intact in fingers and toes.  COORDINATION: There is no trunk or limb dysmetria noted.  GAIT/STANCE: Gait is stable, moderate stride, noticeable right hand shaking, decreased right arm swing, smooth turning, mild retropulsion instability, no assistive device  REVIEW OF SYSTEMS:  Full 14 system review of systems performed and notable only for as above All other review of systems were negative.   ALLERGIES: No Known Allergies  HOME MEDICATIONS: Current Outpatient Medications  Medication Sig Dispense Refill   aspirin EC 81 MG tablet Take 81 mg by mouth daily.     carbidopa-levodopa (SINEMET IR) 25-100 MG tablet Take 1 tablet by mouth 3 (three) times daily.     clonazePAM (KLONOPIN) 1 MG tablet Take 1 mg by mouth at bedtime as needed for anxiety.     fenofibrate (TRIGLIDE) 50 MG tablet Take 50 mg by mouth daily.     fluticasone (CUTIVATE) 0.005 % ointment Apply 1 Application topically 2 (two) times daily.     folic acid (FOLVITE) 1 MG tablet Take 1 tablet (1 mg total) by mouth daily.     midodrine (PROAMATINE) 5 MG tablet Take 0.5 tablets (2.5 mg total) by mouth 2 (two) times daily with a meal. 30 tablet 0   Multiple Vitamin (MULTIVITAMIN WITH MINERALS) TABS tablet Take  1 tablet by mouth daily.     niacin 500 MG tablet Take 500 mg by mouth daily.     omega-3 acid ethyl esters (LOVAZA) 1 G capsule Take 1 g by mouth daily.     thiamine (VITAMIN B-1) 100 MG tablet Take 1 tablet (100 mg total) by mouth daily.     No current facility-administered medications for this visit.    PAST MEDICAL HISTORY: Past Medical History:  Diagnosis Date   Anxiety    Hypertension     PAST SURGICAL HISTORY: Past Surgical History:  Procedure Laterality Date   NO PAST SURGERIES      FAMILY HISTORY: Family History  Problem Relation Age of Onset   Healthy Mother    Alcohol abuse Father    Tremor Neg Hx    Parkinson's disease Neg Hx     SOCIAL HISTORY: Social History   Socioeconomic History   Marital status: Single    Spouse name: Not on file   Number of children: Not on file   Years  of education: Not on file   Highest education level: Not on file  Occupational History   Not on file  Tobacco Use   Smoking status: Never   Smokeless tobacco: Never  Vaping Use   Vaping Use: Never used  Substance and Sexual Activity   Alcohol use: Yes    Alcohol/week: 2.0 standard drinks of alcohol    Types: 2 Standard drinks or equivalent per week    Comment: 16 oz per week "I'm not really a drinker"   Drug use: No   Sexual activity: Not on file  Other Topics Concern   Not on file  Social History Narrative   ** Merged History Encounter **       Lives at home with sister Left handed Caffeine: 1 cup in the morning    Social Determinants of Health   Financial Resource Strain: Not on file  Food Insecurity: Not on file  Transportation Needs: Not on file  Physical Activity: Not on file  Stress: Not on file  Social Connections: Not on file  Intimate Partner Violence: Not on file     Total time spent reviewing the chart, obtaining history, examined patient, ordering tests, documentation, consultations and family, care coordination was  65  Levert Feinstein, M.D.  Ph.D.  [facility] [address] [place], [place] [address] Phone: [phone] Fax:      [phone]

## 2022-02-22 NOTE — Patient Instructions (Signed)
Meds ordered this encounter  Medications   rOPINIRole (REQUIP XL) 2 MG 24 hr tablet    Sig: Take 2 tablets (4 mg total) by mouth at bedtime. At 8pm    Dispense:  60 tablet    Refill:  6   carbidopa-levodopa (SINEMET IR) 25-100 MG tablet   at 8am 11am, 2pm, 5pm    Sig: Take 1.5 tablets by mouth 3 (three) times daily.    Dispense:  180 tablet    Refill:  6

## 2022-02-23 ENCOUNTER — Telehealth: Payer: Self-pay

## 2022-02-23 LAB — COMPREHENSIVE METABOLIC PANEL
ALT: 15 IU/L (ref 0–44)
AST: 22 IU/L (ref 0–40)
Albumin/Globulin Ratio: 1.4 (ref 1.2–2.2)
Albumin: 4 g/dL (ref 3.9–4.9)
Alkaline Phosphatase: 103 IU/L (ref 44–121)
BUN/Creatinine Ratio: 14 (ref 10–24)
BUN: 14 mg/dL (ref 8–27)
Bilirubin Total: 0.7 mg/dL (ref 0.0–1.2)
CO2: 23 mmol/L (ref 20–29)
Calcium: 9.1 mg/dL (ref 8.6–10.2)
Chloride: 105 mmol/L (ref 96–106)
Creatinine, Ser: 1.03 mg/dL (ref 0.76–1.27)
Globulin, Total: 2.8 g/dL (ref 1.5–4.5)
Glucose: 97 mg/dL (ref 70–99)
Potassium: 4.5 mmol/L (ref 3.5–5.2)
Sodium: 141 mmol/L (ref 134–144)
Total Protein: 6.8 g/dL (ref 6.0–8.5)
eGFR: 79 mL/min/{1.73_m2} (ref >59)

## 2022-02-23 LAB — CK: Total CK: 20 U/L — ABNORMAL LOW (ref 41–331)

## 2022-02-23 NOTE — Telephone Encounter (Signed)
-----   Message from Todd Yan, MD sent at 02/23/2022  9:01 AM EDT ----- Please call patient, laboratory evaluation showed no significant abnormalities. 

## 2022-02-23 NOTE — Telephone Encounter (Signed)
I called pt vm not set up and unable to lvm.  Will have to call back.

## 2022-02-28 ENCOUNTER — Telehealth: Payer: Self-pay

## 2022-02-28 NOTE — Telephone Encounter (Signed)
-----   Message from Marcial Pacas, MD sent at 02/23/2022  9:01 AM EDT ----- Please call patient, laboratory evaluation showed no significant abnormalities.

## 2022-02-28 NOTE — Telephone Encounter (Signed)
Pt verified by name and DOB, results given per provider, pt voiced understanding all question answered. 

## 2022-03-06 DIAGNOSIS — T796XXD Traumatic ischemia of muscle, subsequent encounter: Secondary | ICD-10-CM | POA: Diagnosis not present

## 2022-03-06 DIAGNOSIS — G20A1 Parkinson's disease without dyskinesia, without mention of fluctuations: Secondary | ICD-10-CM | POA: Diagnosis not present

## 2022-03-06 DIAGNOSIS — E039 Hypothyroidism, unspecified: Secondary | ICD-10-CM | POA: Diagnosis not present

## 2022-03-06 DIAGNOSIS — U071 COVID-19: Secondary | ICD-10-CM | POA: Diagnosis not present

## 2022-03-06 DIAGNOSIS — G9341 Metabolic encephalopathy: Secondary | ICD-10-CM | POA: Diagnosis not present

## 2022-03-06 DIAGNOSIS — Z9181 History of falling: Secondary | ICD-10-CM | POA: Diagnosis not present

## 2022-03-06 DIAGNOSIS — E782 Mixed hyperlipidemia: Secondary | ICD-10-CM | POA: Diagnosis not present

## 2022-03-06 DIAGNOSIS — N1832 Chronic kidney disease, stage 3b: Secondary | ICD-10-CM | POA: Diagnosis not present

## 2022-03-06 DIAGNOSIS — I129 Hypertensive chronic kidney disease with stage 1 through stage 4 chronic kidney disease, or unspecified chronic kidney disease: Secondary | ICD-10-CM | POA: Diagnosis not present

## 2022-03-06 DIAGNOSIS — F419 Anxiety disorder, unspecified: Secondary | ICD-10-CM | POA: Diagnosis not present

## 2022-03-06 DIAGNOSIS — N390 Urinary tract infection, site not specified: Secondary | ICD-10-CM | POA: Diagnosis not present

## 2022-03-06 DIAGNOSIS — Z8701 Personal history of pneumonia (recurrent): Secondary | ICD-10-CM | POA: Diagnosis not present

## 2022-03-06 DIAGNOSIS — Z993 Dependence on wheelchair: Secondary | ICD-10-CM | POA: Diagnosis not present

## 2022-03-06 DIAGNOSIS — B961 Klebsiella pneumoniae [K. pneumoniae] as the cause of diseases classified elsewhere: Secondary | ICD-10-CM | POA: Diagnosis not present

## 2022-03-23 DIAGNOSIS — E039 Hypothyroidism, unspecified: Secondary | ICD-10-CM | POA: Diagnosis not present

## 2022-03-23 DIAGNOSIS — R7303 Prediabetes: Secondary | ICD-10-CM | POA: Diagnosis not present

## 2022-03-23 DIAGNOSIS — E785 Hyperlipidemia, unspecified: Secondary | ICD-10-CM | POA: Diagnosis not present

## 2022-03-23 DIAGNOSIS — I10 Essential (primary) hypertension: Secondary | ICD-10-CM | POA: Diagnosis not present

## 2022-06-04 NOTE — Progress Notes (Signed)
IV  Chief Complaint  Patient presents with   Follow-up    Patient in room #2 and alone. Patient here today to f/u with his tremor.    ASSESSMENT AND PLAN  Todd [name] is a 67 y.o. male   Parkinsonism Cognitive impairment  Increased Requip XL to 6mg  nightly for tremor Cog stable since prior visit - Prior MOCA 20/30 MRI of brain, generalized atrophy small vessel disease.    Follow-up in 4 months or call earlier if needed   DIAGNOSTIC DATA (LABS, IMAGING, TESTING) - I reviewed patient records, labs, notes, testing and imaging myself where available.   MEDICAL HISTORY:  Update 06/05/2022 JM:   Was started on Requip XL at prior visit for continued tremors. Reports some improvement of tremor since starting Requip, currently taking 4mg  nightly. He is interested in increasing dosage if able. Continues to c/o insomnia, unchanged since prior visit. Mild cognitive impairment without change since prior visit.      UPDATE Feb 22 2022 Dr. Krista Blue: Patient is alone at today's clinical visit, complains of worsening shaking over the past few months, hospital admission on January 20, 2022 after she fell at home,  The day of admission, he mowed his lawn, became dehydrated, felt weak, after he went inside the house, he fell, could not get up, he still lives with his sister, his sister has to make the bed on the floor for him, about 20 hours into the event, he was noted to be confused,  Personally reviewed ER evaluation, MRI of the brain showed moderate age-appropriate, moderate generalized atrophy, small vessel disease, no acute abnormality CT angiogram head and neck showed no large vessel disease Chest x-ray showed no acute process  Laboratory evaluation showed normal negative HIV, procalcitonin, lactic acid, C-reactive protein, RPR, B12, TSH, UDS was positive for marijuana, C-reactive protein was mildly elevated at 8.2,  He was found to be COVID-positive, diagnosed with metabolic  encephalopathy, was treated with IV fluid, IV Decadron, symptoms quickly improved, EEG showed no significant abnormality  He has mild memory loss, MoCA examination 20/30 today  Update 07/13/2021 JM: Patient returns for 65-month follow-up. His largest concern is in regards to continued tremors which interfere with his sleep.  Tremors did not improve after starting Sinemet (initially started for parkinsonian type features). Reports no benefit for sleep with use of seroquel but did not increase dose as previously advised by Dr. Krista Blue (on 25mg  nightly per PCP, Dr. Krista Blue recommended increasing to 50 mg nightly).    He is frustrated regarding continued tremors and difficulty sleeping. Reports difficulty initially falling asleep due to his tremors but will wake up in the middle of the night and not be able to fall back asleep. He has not previously had sleep study. He does not wish to be "drugged up" and "being a regular in this office". He is frustrated that we are not able to tell him exactly what is wrong with him. Tremors do not bother him during the day reporting "they are just there and I deal with it".    Dr. Krista Blue was able to come speak to patient regarding his concerns.  Discussed indication for wanting to proceed with imaging such as MRI or DAT scan but unfortunately patient continues to decline but unable to give specific reason. Discussion regarding pursuing sleep study to eval for sleep apnea but he also declines this evaluation.    Consult visit 04/14/2021 Dr. Krista Blue: Todd [name] is a 67 year old male, seen in request by his primary  care physician Dr.   Sharyn Lull, [facility], for evaluation of tremor, difficulty sleeping, initial evaluation was on April 14, 2021.  I reviewed and summarized the referring note. 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, since retirement few years ago, moved from [place] to [place],  become very sedentary, around 2021, he noticed right hand tremor, per history progress quickly, now involving right leg, also left hand, He denies gait abnormality, denies loss sense of smell His main concern is his worsening chronic insomnia, has been ongoing for many years, used to work shift job, was put on clonazepam 1 mg every night for at least for 3 years now, worked for a while, now even after taking clonazepam 1 mg every night, he has difficulty falling to sleep, reported only sleep couple hours every night, waking up very bothered by his right hand shaking,     PHYSICAL EXAM:   Vitals:   06/05/22 1316  BP: 122/79  Pulse: 65  Weight: 174 lb 9.6 oz (79.2 kg)  Height: 5\' 8"  (1.727 m)     Not recorded     Body mass index is 26.55 kg/m.  PHYSICAL EXAMNIATION:  Gen: NAD, conversant, very pleasant middle-aged male, well nourised, well groomed                     Cardiovascular: Regular rate rhythm, no peripheral edema, warm, nontender. Eyes: Conjunctivae clear without exudates or hemorrhage Neck: Supple, no carotid bruits. Pulmonary: Clear to auscultation bilaterally   NEUROLOGICAL EXAM:  MENTAL STATUS: Speech/cognition: Awake, alert, oriented to history taking care of conversation     02/22/2022    1:00 PM  Montreal Cognitive Assessment   Visuospatial/ Executive (0/5) 3  Naming (0/3) 3  Attention: Read list of digits (0/2) 1  Attention: Read list of letters (0/1) 0  Attention: Serial 7 subtraction starting at 100 (0/3) 2  Language: Repeat phrase (0/2) 2  Language : Fluency (0/1) 1  Abstraction (0/2) 2  Delayed Recall (0/5) 0  Orientation (0/6) 6  Total 20      CRANIAL NERVES: CN II: Visual fields are full to confrontation. Pupils are round equal and briskly reactive to light. CN III, IV, VI: extraocular movement are normal. No ptosis. CN V: Facial sensation is intact to light touch CN VII: Face is symmetric with normal eye closure  CN VIII: Hearing is  normal to causal conversation. CN IX, X: Phonation is normal. CN XI: Head turning and shoulder shrug are intact  MOTOR: Almost constant right upper extremity resting tremor, right leg shaking intermittently, frequent lesser amplitude of left upper extremity shaking, no weakness, right more than left mild rigidity, bradykinesia,   REFLEXES: Reflexes are 2+ and symmetric at the biceps, triceps, knees, and ankles. Plantar responses are flexor.  SENSORY: Intact to light touch, pinprick and vibratory sensation are intact in fingers and toes.  COORDINATION: There is no trunk or limb dysmetria noted.  GAIT/STANCE: Gait is stable, moderate stride, noticeable right hand shaking, decreased right arm swing, smooth turning, mild retropulsion instability, no assistive device   REVIEW OF SYSTEMS:  Full 14 system review of systems performed and notable only for as above All other review of systems were negative.   ALLERGIES: No Known Allergies  HOME MEDICATIONS: Current Outpatient Medications  Medication Sig Dispense Refill   aspirin EC 81 MG tablet Take 81 mg by mouth daily.  carbidopa-levodopa (SINEMET IR) 25-100 MG tablet Take 1.5 tablets by mouth 3 (three) times daily. 180 tablet 6   clonazePAM (KLONOPIN) 1 MG tablet Take 1 mg by mouth at bedtime as needed for anxiety.     fenofibrate (TRIGLIDE) 50 MG tablet Take 50 mg by mouth daily.     fluticasone (CUTIVATE) 0.005 % ointment Apply 1 Application topically 2 (two) times daily.     folic acid (FOLVITE) 1 MG tablet Take 1 tablet (1 mg total) by mouth daily.     midodrine (PROAMATINE) 5 MG tablet Take 0.5 tablets (2.5 mg total) by mouth 2 (two) times daily with a meal. 30 tablet 0   Multiple Vitamin (MULTIVITAMIN WITH MINERALS) TABS tablet Take 1 tablet by mouth daily.     niacin 500 MG tablet Take 500 mg by mouth daily.     omega-3 acid ethyl esters (LOVAZA) 1 G capsule Take 1 g by mouth daily.     rOPINIRole (REQUIP XL) 2 MG 24 hr  tablet Take 2 tablets (4 mg total) by mouth at bedtime. 60 tablet 6   thiamine (VITAMIN B-1) 100 MG tablet Take 1 tablet (100 mg total) by mouth daily.     No current facility-administered medications for this visit.    PAST MEDICAL HISTORY: Past Medical History:  Diagnosis Date   Anxiety    Hypertension     PAST SURGICAL HISTORY: Past Surgical History:  Procedure Laterality Date   NO PAST SURGERIES      FAMILY HISTORY: Family History  Problem Relation Age of Onset   Healthy Mother    Alcohol abuse Father    Tremor Neg Hx    Parkinson's disease Neg Hx     SOCIAL HISTORY: Social History   Socioeconomic History   Marital status: Single    Spouse name: Not on file   Number of children: Not on file   Years of education: Not on file   Highest education level: Not on file  Occupational History   Not on file  Tobacco Use   Smoking status: Never   Smokeless tobacco: Never  Vaping Use   Vaping Use: Never used  Substance and Sexual Activity   Alcohol use: Yes    Alcohol/week: 2.0 standard drinks of alcohol    Types: 2 Standard drinks or equivalent per week    Comment: 16 oz per week "I'm not really a drinker"   Drug use: No   Sexual activity: Not on file  Other Topics Concern   Not on file  Social History Narrative   ** Merged History Encounter **       Lives at home with sister Left handed Caffeine: 1 cup in the morning    Social Determinants of Health   Financial Resource Strain: Not on file  Food Insecurity: Not on file  Transportation Needs: Not on file  Physical Activity: Not on file  Stress: Not on file  Social Connections: Not on file  Intimate Partner Violence: Not on file     I spent 21 minutes of face-to-face and non-face-to-face time with patient.  This included previsit chart review, lab review, study review, order entry, electronic health record documentation, patient education and discussion regarding above diagnoses and treatment plan and  answered all the questions to patient's satisfaction  Frann Rider, [facility]  [facility] [address] [place], [place] [address]  Phone [phone] Fax [phone] Note: This document was prepared with digital dictation and possible smart phrase technology. Any  transcriptional errors that result from this process are unintentional.

## 2022-06-05 ENCOUNTER — Ambulatory Visit (INDEPENDENT_AMBULATORY_CARE_PROVIDER_SITE_OTHER): Payer: PPO | Admitting: Adult Health

## 2022-06-05 ENCOUNTER — Encounter: Payer: Self-pay | Admitting: Adult Health

## 2022-06-05 VITALS — BP 122/79 | HR 65 | Ht 68.0 in | Wt 174.6 lb

## 2022-06-05 DIAGNOSIS — G20C Parkinsonism, unspecified: Secondary | ICD-10-CM

## 2022-06-05 DIAGNOSIS — R251 Tremor, unspecified: Secondary | ICD-10-CM | POA: Diagnosis not present

## 2022-06-05 MED ORDER — ROPINIROLE HCL ER 2 MG PO TB24: 6.0000 mg | ORAL_TABLET | Freq: Every day | ORAL | 5 refills | Status: DC

## 2022-06-05 NOTE — Patient Instructions (Addendum)
Increase Requip to 6mg  nightly (3 tablets at night) for continued tremors    Follow up with Dr. Krista Blue in 4 months or call earlier if needed

## 2022-10-01 ENCOUNTER — Ambulatory Visit: Payer: PPO | Admitting: Neurology

## 2022-10-01 ENCOUNTER — Encounter: Payer: Self-pay | Admitting: Neurology

## 2022-10-01 VITALS — BP 122/83 | HR 76 | Ht 68.0 in | Wt 171.5 lb

## 2022-10-01 DIAGNOSIS — R4189 Other symptoms and signs involving cognitive functions and awareness: Secondary | ICD-10-CM | POA: Diagnosis not present

## 2022-10-01 DIAGNOSIS — G20A1 Parkinson's disease without dyskinesia, without mention of fluctuations: Secondary | ICD-10-CM

## 2022-10-01 MED ORDER — CARBIDOPA-LEVODOPA 25-100 MG PO TABS: 1.5000 | ORAL_TABLET | Freq: Three times a day (TID) | ORAL | 3 refills | Status: DC

## 2022-10-01 NOTE — Progress Notes (Signed)
IV  Chief Complaint  Patient presents with   Follow-up    Rm 14. Alone. Reports tremors are the same.    ASSESSMENT AND PLAN  Brain Todd [name] is a 67 y.o. male   Parkinsonism Cognitive impairment  MOCA 22/30 MRI of brain, generalized atrophy small vessel disease.  Doing better with Sinemet 25/100 mg 2 tablets 3 times a day, High risk for obstructive sleep apnea  Does not want to sleep study at this point,   DIAGNOSTIC DATA (LABS, IMAGING, TESTING) - I reviewed patient records, labs, notes, testing and imaging myself where available.   MEDICAL HISTORY:  Todd [name] is a 67 year old male, seen in request by his primary care physician Dr.   Rinaldo Cloud, for evaluation of tremor, difficulty sleeping, initial evaluation was on April 14, 2021.  I reviewed and summarized the referring note. 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, since retirement few years ago, moved from [place] to [place], become very sedentary, around 2021, he noticed right hand tremor, per history progress quickly, now involving right leg, also left hand, He denies gait abnormality, denies loss sense of smell His main concern is his worsening chronic insomnia, has been ongoing for many years, used to work shift job, was put on clonazepam 1 mg every night for at least for 3 years now, worked for a while, now even after taking clonazepam 1 mg every night, he has difficulty falling to sleep, reported only sleep couple hours every night, waking up very bothered by his right hand shaking,  UPDATE Feb 22 2022: Patient is alone at today's clinical visit, complains of worsening shaking over the past few months, hospital admission on January 20, 2022 after  he fell at home,  The day of admission, he mowed his lawn, became dehydrated, felt weak, after he went inside the house, he fell, could not get up, he still lives  with his sister, his sister has to make the bed on the floor for him, about 20 hours into the event, he was noted to be confused,  Personally reviewed ER evaluation, MRI of the brain showed moderate age-appropriate, moderate generalized atrophy, small vessel disease, no acute abnormality CT angiogram head and neck showed no large vessel disease Chest x-ray showed no acute process  Laboratory evaluation showed normal negative HIV, procalcitonin, lactic acid, C-reactive protein, RPR, B12, TSH, UDS was positive for marijuana, C-reactive protein was mildly elevated at 8.2,  He was found to be COVID-positive, diagnosed with metabolic encephalopathy, was treated with IV fluid, IV Decadron, symptoms quickly improved, EEG showed no significant abnormality  He has mild memory loss, MoCA examination 20/30 today  UPDATE Oct 01 2022: He drove himself to clinic today, taking Sinemet 25/100 mg 2 tablets every 6 hours, overall doing very well, sleep more than 10 hours each day, often wake up due to her shoulder pain, taking frequent naps, was noted to have small jaw, high risk for obstructive sleep apnea, he denies sleep study at this point   Continue has mild memory loss, MoCA examination 22/30   PHYSICAL EXAM:   Vitals:   10/01/22 1328  BP: 122/83  Pulse: 76  Weight: 171 lb 8 oz (77.8 kg)  Height: 5\' 8"  (1.727 m)     Body mass index is 26.08 kg/m.  PHYSICAL EXAMNIATION:  Gen: NAD, conversant, well nourised, well groomed  Cardiovascular: Regular rate rhythm, no peripheral edema, warm, nontender. Eyes: Conjunctivae clear without exudates or hemorrhage Neck: Supple, no carotid bruits. Pulmonary: Clear to auscultation bilaterally   NEUROLOGICAL EXAM:  MENTAL STATUS: Speech/cognition: Awake, alert, oriented to history taking care of conversation    10/01/2022    2:04 PM 02/22/2022    1:00 PM  Montreal Cognitive Assessment   Visuospatial/ Executive (0/5) 4 3  Naming  (0/3) 3 3  Attention: Read list of digits (0/2) 2 1  Attention: Read list of letters (0/1) 1 0  Attention: Serial 7 subtraction starting at 100 (0/3) 3 2  Language: Repeat phrase (0/2) 2 2  Language : Fluency (0/1) 0 1  Abstraction (0/2) 2 2  Delayed Recall (0/5) 0 0  Orientation (0/6) 5 6  Total 22 20      CRANIAL NERVES: CN II: Visual fields are full to confrontation. Pupils are round equal and briskly reactive to light. CN III, IV, VI: extraocular movement are normal. No ptosis. CN V: Facial sensation is intact to light touch CN VII: Face is symmetric with normal eye closure  CN VIII: Hearing is normal to causal conversation. CN IX, X: Phonation is normal. CN XI: Head turning and shoulder shrug are intact  MOTOR: Right more than left upper extremity resting tremor, rigidity, bradykinesia,  REFLEXES: Reflexes are 2+ and symmetric at the biceps, triceps, knees, and ankles. Plantar responses are flexor.  SENSORY: Intact to light touch, pinprick and vibratory sensation are intact in fingers and toes.  COORDINATION: There is no trunk or limb dysmetria noted.  GAIT/STANCE: Gait is stable, moderate stride, noticeable right hand shaking, decreased right arm swing, smooth turning   REVIEW OF SYSTEMS:  Full 14 system review of systems performed and notable only for as above All other review of systems were negative.   ALLERGIES: No Known Allergies  HOME MEDICATIONS: Current Outpatient Medications  Medication Sig Dispense Refill   aspirin EC 81 MG tablet Take 81 mg by mouth daily.     carbidopa-levodopa (SINEMET IR) 25-100 MG tablet Take 1.5 tablets by mouth 3 (three) times daily. 180 tablet 6   clonazePAM (KLONOPIN) 1 MG tablet Take 1 mg by mouth at bedtime as needed for anxiety.     fenofibrate (TRIGLIDE) 50 MG tablet Take 50 mg by mouth daily.     fluticasone (CUTIVATE) 0.005 % ointment Apply 1 Application topically 2 (two) times daily.     folic acid (FOLVITE) 1 MG  tablet Take 1 tablet (1 mg total) by mouth daily.     midodrine (PROAMATINE) 5 MG tablet Take 0.5 tablets (2.5 mg total) by mouth 2 (two) times daily with a meal. 30 tablet 0   Multiple Vitamin (MULTIVITAMIN WITH MINERALS) TABS tablet Take 1 tablet by mouth daily.     niacin 500 MG tablet Take 500 mg by mouth daily.     omega-3 acid ethyl esters (LOVAZA) 1 G capsule Take 1 g by mouth daily.     rOPINIRole (REQUIP XL) 2 MG 24 hr tablet Take 3 tablets (6 mg total) by mouth at bedtime. 90 tablet 5   thiamine (VITAMIN B-1) 100 MG tablet Take 1 tablet (100 mg total) by mouth daily.     No current facility-administered medications for this visit.    PAST MEDICAL HISTORY: Past Medical History:  Diagnosis Date   Anxiety    Hypertension     PAST SURGICAL HISTORY: Past Surgical History:  Procedure Laterality Date   NO PAST SURGERIES  FAMILY HISTORY: Family History  Problem Relation Age of Onset   Healthy Mother    Alcohol abuse Father    Tremor Neg Hx    Parkinson's disease Neg Hx     SOCIAL HISTORY: Social History   Socioeconomic History   Marital status: Single    Spouse name: Not on file   Number of children: Not on file   Years of education: Not on file   Highest education level: Not on file  Occupational History   Not on file  Tobacco Use   Smoking status: Never   Smokeless tobacco: Never  Vaping Use   Vaping Use: Never used  Substance and Sexual Activity   Alcohol use: Yes    Alcohol/week: 2.0 standard drinks of alcohol    Types: 2 Standard drinks or equivalent per week    Comment: 16 oz per week "I'm not really a drinker"   Drug use: No   Sexual activity: Not on file  Other Topics Concern   Not on file  Social History Narrative   ** Merged History Encounter **       Lives at home with sister Left handed Caffeine: 1 cup in the morning    Social Determinants of Health   Financial Resource Strain: Not on file  Food Insecurity: Not on file   Transportation Needs: Not on file  Physical Activity: Not on file  Stress: Not on file  Social Connections: Not on file  Intimate Partner Violence: Not on file     Levert Feinstein, M.D. Ph.D.  [facility] [address] [place], [place] [address] Phone: [phone] Fax:      [phone]

## 2023-10-05 ENCOUNTER — Other Ambulatory Visit: Payer: Self-pay | Admitting: Neurology

## 2023-10-13 ENCOUNTER — Other Ambulatory Visit: Payer: Self-pay | Admitting: Neurology

## 2023-10-14 ENCOUNTER — Other Ambulatory Visit: Payer: Self-pay

## 2023-10-14 ENCOUNTER — Telehealth: Payer: Self-pay | Admitting: Cardiology

## 2023-10-14 MED ORDER — CARBIDOPA-LEVODOPA 25-100 MG PO TABS: 2.0000 | ORAL_TABLET | Freq: Three times a day (TID) | ORAL | 0 refills | Status: DC

## 2023-10-14 NOTE — Telephone Encounter (Signed)
 Called pt to clarify medication dosage for Sinemet . Pt reported taking 2 tablets 3x daily, prescription said 1.5 tablets, but last office note said 2 tablets. Will have RN send new script for Dr. Gracie Lav to sign off on.

## 2023-10-14 NOTE — Progress Notes (Signed)
 Meds ordered this encounter  Medications   carbidopa -levodopa  (SINEMET  IR) 25-100 MG tablet    Sig: Take 2 tablets by mouth 3 (three) times daily.    Dispense:  180 tablet    Refill:  0

## 2023-10-16 ENCOUNTER — Other Ambulatory Visit: Payer: Self-pay | Admitting: Neurology

## 2023-10-18 ENCOUNTER — Telehealth: Payer: Self-pay | Admitting: Neurology

## 2023-10-18 NOTE — Telephone Encounter (Signed)
 Appointment details confirmed

## 2023-10-21 ENCOUNTER — Encounter: Payer: Self-pay | Admitting: Neurology

## 2023-10-21 ENCOUNTER — Ambulatory Visit: Admitting: Neurology

## 2023-10-21 VITALS — BP 118/84 | HR 69 | Resp 16 | Ht 67.0 in | Wt 169.4 lb

## 2023-10-21 DIAGNOSIS — G20A1 Parkinson's disease without dyskinesia, without mention of fluctuations: Secondary | ICD-10-CM | POA: Diagnosis not present

## 2023-10-21 DIAGNOSIS — R4189 Other symptoms and signs involving cognitive functions and awareness: Secondary | ICD-10-CM

## 2023-10-21 MED ORDER — CARBIDOPA-LEVODOPA 25-100 MG PO TABS: 2.0000 | ORAL_TABLET | Freq: Three times a day (TID) | ORAL | 3 refills | Status: DC

## 2023-10-21 MED ORDER — ROPINIROLE HCL ER 6 MG PO TB24: 6.0000 mg | ORAL_TABLET | Freq: Every day | ORAL | 3 refills | Status: DC

## 2023-10-21 NOTE — Progress Notes (Signed)
 IV  Chief Complaint  Patient presents with   Follow-up    Rm15, alone, Parkinson's disease without dyskinesia, unspecified whether manifestations fluctuate: tremors worsening due to running out of sinimet, moca score of 20     ASSESSMENT AND PLAN  Todd [name] is a 68 y.o. male   Parkinsonism Cognitive impairment  MOCA 22/30 MRI of brain, generalized atrophy small vessel disease. He parkinsonism seems to start around the same time with his cognitive impairment, Parkinson plus such as Lewy body dementia versus idiopathic Parkinson's disease, He does report responding to Sinemet  25/100 mg 2 tablets 3 times a day and Requip  XL 60 mg daily, refilled her prescription, return to clinic In 6 months  Encouraging moderate exercise   DIAGNOSTIC DATA (LABS, IMAGING, TESTING) - I reviewed patient records, labs, notes, testing and imaging myself where available.   MEDICAL HISTORY:  Todd [name] is a 68 year old male, seen in request by his primary care physician Dr.   Chapman Commodore, for evaluation of tremor, difficulty sleeping, initial evaluation was on April 14, 2021.  I reviewed and summarized the referring note. 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, since retirement few years ago, moved from [place] to [place] , become very sedentary, around 2021, he noticed right hand tremor, per history progress quickly, now involving right leg, also left hand, He denies gait abnormality, denies loss sense of smell His main concern is his worsening chronic insomnia, has been ongoing for many years, used to work shift job, was put on clonazepam 1 mg every night for at least for 3 years now, worked for a while, now even after taking clonazepam 1 mg every night, he has difficulty falling to sleep, reported only sleep couple hours every night, waking up very bothered by his right hand shaking,  UPDATE  Feb 22 2022: Patient is alone at today's clinical visit, complains of worsening shaking over the past few months, hospital admission on January 20, 2022 after  he fell at home,  The day of admission, he mowed his lawn, became dehydrated, felt weak, after he went inside the house, he fell, could not get up, he still lives with his sister, his sister has to make the bed on the floor for him, about 20 hours into the event, he was noted to be confused,  Personally reviewed ER evaluation, MRI of the brain showed moderate age-appropriate, moderate generalized atrophy, small vessel disease, no acute abnormality CT angiogram head and neck showed no large vessel disease Chest x-ray showed no acute process  Laboratory evaluation showed normal negative HIV, procalcitonin, lactic acid, C-reactive protein, RPR, B12, TSH, UDS was positive for marijuana, C-reactive protein was mildly elevated at 8.2,  He was found to be COVID-positive, diagnosed with metabolic encephalopathy, was treated with IV fluid, IV Decadron , symptoms quickly improved, EEG showed no significant abnormality  He has mild memory loss, MoCA examination 20/30 today  UPDATE Oct 01 2022: He drove himself to clinic today, taking Sinemet  25/100 mg 2 tablets every 6 hours, overall doing very well, sleep more than 10 hours each day, often wake up due to his shoulder pain, taking frequent naps, was noted to have small jaw, high risk for obstructive sleep apnea, he denies sleep study at this point   Continue has mild memory loss, MoCA examination 22/30  UPDATE June 9th 2025: He drove himself to clinic today, lives with his sister, remain active, have home project,  also like to go to casino, denies obsessive compulsive behavior, sinemet  25/100 mg 2 tablets 3 times a day plus Requip  XL 2 mg 3 tablets every night has helped him, walking better, sleep better, would like to keep on the same dosage  He denies hallucinations significant fluctuation, MoCA  examination 20/30,  He has mild parkinsonian features along with cognitive impairment which started around the same time, not sure if Parkinson disease such as Lewy body dementia, versus idiopathic Parkinson's,   PHYSICAL EXAM:   Vitals:   10/21/23 1310  BP: 118/84  Pulse: 69  Resp: 16  SpO2: 91%  Weight: 169 lb 6.4 oz (76.8 kg)  Height: 5\' 7"  (1.702 m)     Body mass index is 26.53 kg/m.  PHYSICAL EXAMNIATION:  Gen: NAD, conversant, well nourised, well groomed                     Cardiovascular: Regular rate rhythm, no peripheral edema, warm, nontender. Eyes: Conjunctivae clear without exudates or hemorrhage Neck: Supple, no carotid bruits. Pulmonary: Clear to auscultation bilaterally   NEUROLOGICAL EXAM:  MENTAL STATUS: Speech/cognition: Awake, alert, oriented to history taking care of conversation, mild decreased facial expression    10/21/2023    1:18 PM 10/01/2022    2:04 PM 02/22/2022    1:00 PM  Montreal Cognitive Assessment   Visuospatial/ Executive (0/5) 4 4 3   Naming (0/3) 3 3 3   Attention: Read list of digits (0/2) 2 2 1   Attention: Read list of letters (0/1) 1 1 0  Attention: Serial 7 subtraction starting at 100 (0/3) 1 3 2   Language: Repeat phrase (0/2) 1 2 2   Language : Fluency (0/1) 1 0 1  Abstraction (0/2) 2 2 2   Delayed Recall (0/5) 0 0 0  Orientation (0/6) 5 5 6   Total 20 22 20       CRANIAL NERVES: CN II: Visual fields are full to confrontation. Pupils are round equal and briskly reactive to light. CN III, IV, VI: Mild restriction of upward gaze, CN V: Facial sensation is intact to light touch CN VII: Face is symmetric with normal eye closure  CN VIII: Hearing is normal to causal conversation. CN IX, X: Phonation is normal. CN XI: Head turning and shoulder shrug are intact  MOTOR: Right more than left upper extremity resting tremor, rigidity, bradykinesia,  REFLEXES: Reflexes are 2+ and symmetric at the biceps, triceps, knees, and ankles.  Plantar responses are flexor.  SENSORY: Intact to light touch, pinprick and vibratory sensation are intact in fingers and toes.  COORDINATION: There is no trunk or limb dysmetria noted.  GAIT/STANCE: Gait is stable, moderate stride, noticeable right hand shaking, decreased right arm swing, mild en bloc turning REVIEW OF SYSTEMS:  Full 14 system review of systems performed and notable only for as above All other review of systems were negative.   ALLERGIES: No Known Allergies  HOME MEDICATIONS: Current Outpatient Medications  Medication Sig Dispense Refill   aspirin  EC 81 MG tablet Take 81 mg by mouth daily.     carbidopa -levodopa  (SINEMET  IR) 25-100 MG tablet TAKE 1 & 1/2 (ONE & ONE-HALF) TABLETS BY MOUTH THREE TIMES DAILY 135 tablet 0   clonazePAM (KLONOPIN) 1 MG tablet Take 1 mg by mouth at bedtime as needed for anxiety.     fenofibrate  (TRIGLIDE ) 50 MG tablet Take 50 mg by mouth daily.     fluticasone (CUTIVATE) 0.005 % ointment Apply 1 Application topically 2 (two) times daily.  folic acid  (FOLVITE ) 1 MG tablet Take 1 tablet (1 mg total) by mouth daily.     midodrine  (PROAMATINE ) 5 MG tablet Take 0.5 tablets (2.5 mg total) by mouth 2 (two) times daily with a meal. 30 tablet 0   Multiple Vitamin (MULTIVITAMIN WITH MINERALS) TABS tablet Take 1 tablet by mouth daily.     niacin 500 MG tablet Take 500 mg by mouth daily.     omega-3 acid ethyl esters (LOVAZA ) 1 G capsule Take 1 g by mouth daily.     rOPINIRole  (REQUIP  XL) 2 MG 24 hr tablet Take 3 tablets (6 mg total) by mouth at bedtime. 90 tablet 5   thiamine  (VITAMIN B-1) 100 MG tablet Take 1 tablet (100 mg total) by mouth daily.     No current facility-administered medications for this visit.    PAST MEDICAL HISTORY: Past Medical History:  Diagnosis Date   Anxiety    Hypertension     PAST SURGICAL HISTORY: Past Surgical History:  Procedure Laterality Date   NO PAST SURGERIES      FAMILY HISTORY: Family History   Problem Relation Age of Onset   Healthy Mother    Alcohol abuse Father    Tremor Neg Hx    Parkinson's disease Neg Hx     SOCIAL HISTORY: Social History   Socioeconomic History   Marital status: Single    Spouse name: Not on file   Number of children: Not on file   Years of education: Not on file   Highest education level: Not on file  Occupational History   Not on file  Tobacco Use   Smoking status: Never   Smokeless tobacco: Never  Vaping Use   Vaping status: Never Used  Substance and Sexual Activity   Alcohol use: Yes    Alcohol/week: 2.0 standard drinks of alcohol    Types: 2 Standard drinks or equivalent per week    Comment: 16 oz per week "I'm not really a drinker"   Drug use: No   Sexual activity: Not on file  Other Topics Concern   Not on file  Social History Narrative   ** Merged History Encounter **       Lives at home with sister Left handed Caffeine: 1 cup in the morning    Social Drivers of Health   Financial Resource Strain: Not on file  Food Insecurity: Not on file  Transportation Needs: Not on file  Physical Activity: Not on file  Stress: Not on file  Social Connections: Not on file  Intimate Partner Violence: Not on file     Phebe Brasil, M.D. Ph.D.  [facility] [address] [place], [place] [address] Phone: [phone] Fax:      [phone]

## 2023-11-26 ENCOUNTER — Telehealth: Payer: Self-pay | Admitting: Neurology

## 2023-11-26 NOTE — Telephone Encounter (Signed)
 Patient would like refill  carbidopa -levodopa  (SINEMET  IR) 25-100 MG tablet  and rOPINIRole  6 MG TB24

## 2023-11-26 NOTE — Telephone Encounter (Signed)
 Pharmacy [facility]

## 2023-12-03 MED ORDER — ROPINIROLE HCL ER 6 MG PO TB24: 6.0000 mg | ORAL_TABLET | Freq: Every day | ORAL | 3 refills | Status: DC

## 2023-12-03 MED ORDER — CARBIDOPA-LEVODOPA 25-100 MG PO TABS: 2.0000 | ORAL_TABLET | Freq: Three times a day (TID) | ORAL | 3 refills | Status: DC

## 2023-12-03 NOTE — Telephone Encounter (Signed)
 Pt has called again stating that the pharmacy has still not received this request. Pt is needing this as soon as possible.

## 2023-12-03 NOTE — Telephone Encounter (Signed)
 refilled

## 2023-12-03 NOTE — Addendum Note (Signed)
 Addended by: ONEITA HOIST E on: 12/03/2023 11:13 AM   Modules accepted: Orders

## 2024-04-30 ENCOUNTER — Ambulatory Visit: Admitting: Adult Health

## 2024-06-16 ENCOUNTER — Ambulatory Visit: Admitting: Adult Health

## 2024-06-16 ENCOUNTER — Encounter: Payer: Self-pay | Admitting: Adult Health

## 2024-06-16 VITALS — BP 120/76 | HR 60 | Ht 68.0 in | Wt 176.4 lb

## 2024-06-16 DIAGNOSIS — R4189 Other symptoms and signs involving cognitive functions and awareness: Secondary | ICD-10-CM | POA: Diagnosis not present

## 2024-06-16 DIAGNOSIS — G20A1 Parkinson's disease without dyskinesia, without mention of fluctuations: Secondary | ICD-10-CM

## 2024-06-16 MED ORDER — ROPINIROLE HCL ER 6 MG PO TB24: 6.0000 mg | ORAL_TABLET | Freq: Every day | ORAL | 3 refills | Status: AC

## 2024-06-16 MED ORDER — CARBIDOPA-LEVODOPA 25-100 MG PO TABS: 2.0000 | ORAL_TABLET | Freq: Three times a day (TID) | ORAL | 3 refills | Status: AC

## 2024-06-16 NOTE — Patient Instructions (Addendum)
 Todd [name]

## 2025-01-04 ENCOUNTER — Ambulatory Visit: Admitting: Adult Health
